# Patient Record
Sex: Male | Born: 1935 | Race: White | Hispanic: No | Marital: Married | Smoking: Never smoker
Health system: Southern US, Community
[De-identification: ages and names within clinical notes are randomized; demographics above are authoritative.]

## PROBLEM LIST (undated history)

## (undated) DIAGNOSIS — E785 Hyperlipidemia, unspecified: Secondary | ICD-10-CM

## (undated) DIAGNOSIS — B029 Zoster without complications: Secondary | ICD-10-CM

## (undated) DIAGNOSIS — S43006A Unspecified dislocation of unspecified shoulder joint, initial encounter: Secondary | ICD-10-CM

## (undated) DIAGNOSIS — L570 Actinic keratosis: Secondary | ICD-10-CM

## (undated) DIAGNOSIS — G4730 Sleep apnea, unspecified: Secondary | ICD-10-CM

## (undated) DIAGNOSIS — G5620 Lesion of ulnar nerve, unspecified upper limb: Secondary | ICD-10-CM

## (undated) DIAGNOSIS — K219 Gastro-esophageal reflux disease without esophagitis: Secondary | ICD-10-CM

## (undated) DIAGNOSIS — K649 Unspecified hemorrhoids: Secondary | ICD-10-CM

## (undated) HISTORY — PX: JOINT REPLACEMENT: SHX530

## (undated) HISTORY — PX: OTHER SURGICAL HISTORY: SHX169

## (undated) HISTORY — PX: FRACTURE SURGERY: SHX138

## (undated) HISTORY — PX: COLONOSCOPY: SHX174

## (undated) HISTORY — DX: Actinic keratosis: L57.0

## (undated) HISTORY — PX: BACK SURGERY: SHX140

---

## 2004-12-21 ENCOUNTER — Encounter: Payer: Self-pay | Admitting: Orthopaedic Surgery

## 2005-01-05 ENCOUNTER — Encounter: Payer: Self-pay | Admitting: Orthopaedic Surgery

## 2005-02-04 ENCOUNTER — Encounter: Payer: Self-pay | Admitting: Orthopaedic Surgery

## 2005-03-07 ENCOUNTER — Encounter: Payer: Self-pay | Admitting: Orthopaedic Surgery

## 2006-01-13 ENCOUNTER — Ambulatory Visit: Payer: Self-pay | Admitting: Family Medicine

## 2006-01-13 ENCOUNTER — Ambulatory Visit: Payer: Self-pay | Admitting: Orthopaedic Surgery

## 2006-01-16 ENCOUNTER — Ambulatory Visit: Payer: Self-pay | Admitting: Orthopaedic Surgery

## 2006-05-06 ENCOUNTER — Ambulatory Visit: Payer: Self-pay | Admitting: Gastroenterology

## 2007-09-23 ENCOUNTER — Ambulatory Visit: Payer: Self-pay | Admitting: Family Medicine

## 2010-10-07 DIAGNOSIS — B029 Zoster without complications: Secondary | ICD-10-CM

## 2010-10-07 HISTORY — DX: Zoster without complications: B02.9

## 2013-09-16 DIAGNOSIS — C4492 Squamous cell carcinoma of skin, unspecified: Secondary | ICD-10-CM

## 2013-09-16 HISTORY — DX: Squamous cell carcinoma of skin, unspecified: C44.92

## 2014-06-24 DIAGNOSIS — M169 Osteoarthritis of hip, unspecified: Secondary | ICD-10-CM | POA: Diagnosis present

## 2014-08-10 ENCOUNTER — Ambulatory Visit: Payer: Self-pay | Admitting: Orthopedic Surgery

## 2014-08-10 LAB — URINALYSIS, COMPLETE
Bacteria: NONE SEEN
Bilirubin,UR: NEGATIVE
Blood: NEGATIVE
Glucose,UR: NEGATIVE mg/dL (ref 0–75)
Leukocyte Esterase: NEGATIVE
NITRITE: NEGATIVE
Ph: 6 (ref 4.5–8.0)
Protein: NEGATIVE
RBC,UR: <1 /HPF (ref 0–5)
SQUAMOUS EPITHELIAL: NONE SEEN
Specific Gravity: 1.02 (ref 1.003–1.030)
WBC UR: 1 /HPF (ref 0–5)

## 2014-08-10 LAB — MRSA PCR SCREENING

## 2014-08-19 ENCOUNTER — Inpatient Hospital Stay: Payer: Self-pay | Admitting: Orthopedic Surgery

## 2014-08-20 LAB — BASIC METABOLIC PANEL
Anion Gap: 5 — ABNORMAL LOW (ref 7–16)
BUN: 13 mg/dL (ref 7–18)
CO2: 29 mmol/L (ref 21–32)
Calcium, Total: 7.2 mg/dL — ABNORMAL LOW (ref 8.5–10.1)
Chloride: 105 mmol/L (ref 98–107)
Creatinine: 0.98 mg/dL (ref 0.60–1.30)
EGFR (Non-African Amer.): >60
Glucose: 110 mg/dL — ABNORMAL HIGH (ref 65–99)
Osmolality: 278 (ref 275–301)
Potassium: 4.1 mmol/L (ref 3.5–5.1)
Sodium: 139 mmol/L (ref 136–145)

## 2014-08-20 LAB — HEMOGLOBIN: HGB: 12.1 g/dL — ABNORMAL LOW (ref 13.0–18.0)

## 2014-08-20 LAB — PLATELET COUNT: Platelet: 194 10*3/uL (ref 150–440)

## 2014-08-21 DIAGNOSIS — Z96642 Presence of left artificial hip joint: Secondary | ICD-10-CM | POA: Insufficient documentation

## 2014-08-21 LAB — BASIC METABOLIC PANEL
ANION GAP: 7 (ref 7–16)
BUN: 15 mg/dL (ref 7–18)
CO2: 28 mmol/L (ref 21–32)
CREATININE: 0.81 mg/dL (ref 0.60–1.30)
Calcium, Total: 7.9 mg/dL — ABNORMAL LOW (ref 8.5–10.1)
Chloride: 103 mmol/L (ref 98–107)
EGFR (African American): >60
Glucose: 100 mg/dL — ABNORMAL HIGH (ref 65–99)
Osmolality: 277 (ref 275–301)
Potassium: 3.9 mmol/L (ref 3.5–5.1)
Sodium: 138 mmol/L (ref 136–145)

## 2014-08-21 LAB — HEMOGLOBIN: HGB: 12.5 g/dL — AB (ref 13.0–18.0)

## 2015-01-28 NOTE — Op Note (Signed)
PATIENT NAME:  Harold Nelson, KASER MR#:  [id number] DATE OF BIRTH:  10-20-1934  DATE OF PROCEDURE:  08/19/2014  PREOPERATIVE DIAGNOSIS: Left hip osteoarthritis.   POSTOPERATIVE DIAGNOSIS: Left hip osteoarthritis, possible avascular necrosis.   ANESTHESIA: Spinal.   SURGEON: Hessie Knows, M.D.   DESCRIPTION OF PROCEDURE: The patient was brought to the operating room and after adequate anesthesia was obtained, the patient was placed on the operative table with the Medacta attachment. The left foot was placed in the [place]. The right foot, right leg on a well-padded table. After prepping and draping in the usual sterile fashion, preoperative x-ray was taken as a template and appropriate patient identification and timeout procedures completed.   A direct anterior approach was made centered over the tensor fascia lata muscle and the greater trochanter. The incision was carried down through the skin and subcutaneous tissue getting down to the TFL.  The tensor fascia was incised and the muscle retracted laterally. Deep fascia incised and the lateral circumflex vessels identified and ligated. The anterior capsule was then exposed and a capsulotomy performed. The femoral head was then removed after osteotomy of the femoral neck at the appropriate level. The joint had a large amount of fluid within it and  after removal of the head, it was noted to have peeling off the cartilage with possibly avascular necrosis being a condition as well as significant osteoarthritis. The acetabulum had quite a bit of synovitis. This was debrided along with the labrum. Reaming was carried out to 54 mm, at which point there was good bleeding bone. A 54 mm trial fit well and the 54 mm Versafit cup DM was impacted into position. The femur was then externally rotated. Pubofemoral and partial ischiofemoral releases carried out. The leg was dropped into extension and sequential broaching was carried out up to a size 4, which gave a very  good fit in the canal. Trials were made with the broach after the 4 stem was inserted. The S head and a Versafit cup DM  liner were assembled. After the stem was inserted the dual mobility head was impacted and the hip was reduced. It was quite stable and leg lengths appeared equal to preop x-ray. The hip was then thoroughly irrigated.   The wound was infiltrated with 0.25% Sensorcaine with epinephrine, 30 mL. The wound was closed with a heavy quill for the tensor fascia followed by 2-0 quill subcutaneously and skin staples. Xeroform, 4 x 4's, ABD, and tape applied.   The patient was sent to the recovery room in stable condition.   ESTIMATED BLOOD LOSS: 200 mL.   COMPLICATIONS: None.   SPECIMEN REMOVED: Femoral head.   IMPLANTS: Medacta Amis stem size 4, Versafit cup DM 54 with liner, and an S 28 mm head.    ____________________________ Laurene Footman, MD mjm:bm D: 08/19/2014 18:22:18 ET T: 08/19/2014 21:52:04 ET JOB#: [id number]  cc: Laurene Footman, MD, <Dictator> Laurene Footman MD ELECTRONICALLY SIGNED 08/20/2014 8:58

## 2015-01-28 NOTE — Discharge Summary (Signed)
PATIENT NAME:  Harold Nelson, Harold Nelson MR#:  [id number] DATE OF BIRTH:  January 18, 1935  DATE OF ADMISSION:  08/19/2014 DATE OF DISCHARGE:  08/22/2014  ADMITTING DIAGNOSIS: Left hip osteoarthritis.   DISCHARGE DIAGNOSIS: Left hip osteoarthritis, possible avascular necrosis.  ANESTHESIA: Spinal.   SURGEON: Laurene Footman, M.D.   ESTIMATED BLOOD LOSS: 200 mL.  COMPLICATIONS: None.   SPECIMEN REMOVED: Femoral head.   IMPLANTS: Medacta Amis stem size 4, Versafit cup DM 54 with liner and a S28 mm head.   HISTORY: The patient has had significant pain in the left hip that has been moderate to severe. The patient has been able to get socks and shoes on with some difficulty. He can sit in high chair for 1/2 hour. He can transfer from sitting to standing with use of arms. He can manage stairs normally, one foot over the other with rail. He can pick up an object from the floor with some difficulty. He does not require a gait aide. He can carry with significant limits. He is able to drive a car. The patient's pain is located in the left groin, radiates down the left anterior thigh, and he has had no relief with recent cortisone injection. X-ray shows severe osteoarthritis. Pain is interfering with activities of daily living.   PHYSICAL EXAMINATION: GENERAL: No apparent distress, well nourished, well developed.  EYES: Pupils equal, round, and reactive to light.  LYMPHATIC: No adenopathy.  HEART: Regular rate and rhythm. No murmur.  HEENT: Normal.  LUNGS: Clear to auscultation. No labored breathing. LEFT HIP: The patient has a slow gait with moderate limp. Leg lengths are equal. He has a normal back exam. The left hip shows significant pain with hip internal rotation. There is no pain with external rotation. He has 5/5 strength with hip flexion, abduction, quads, dorsiflexion and plantar flexion. He has 2+ patellar and Achilles reflexes. He is neurovascularly intact in the left lower extremity.   HOSPITAL COURSE:  The patient was admitted to the hospital on August 19, 2014. He had surgery that same day and was brought to the orthopedic floor from the PAC-U in stable condition. On postop day 1, the patient's pain was moderate. He was controlled with medications. Vital signs were stable. He had good progress with physical therapy. On postop day 2, the patient continued to make progress with physical therapy. Labs were stable. Vital signs were stable. On postop day 3, the patient was doing well. He had met goals to go home with home health PT.  CONDITION AT DISCHARGE: Stable.   DISCHARGE INSTRUCTIONS: The patient can increase weight-bearing on the effected extremity. Thigh-high TED hose on both legs to be removed at bedtime and replace when arising the next morning. Elevate the heels off the bed. Incentive spirometer every hour while awake. Encourage cough and deep breathing. The patient may resume a regular diet as tolerated. Apply an ice pack to the effected area. Do not get the dressing or bandage wet or dirty. Call [facility] orthopedics if the dressing gets water under it. Leave the dressing on. Call [facility] orthopedics if any of the following occur: Bright red bleeding from the incision wound, fever above 101.5 degrees, redness, swelling, or drainage at the incision. Call [facility] orthopedics if you experience any increased leg pain, numbness or weakness in your legs or bowel or bladder symptoms. Home physical therapy has been arranged for continuation of rehab program. Please call [facility] orthopedics if a therapist has not contacted you within 51  hours of your return home. Call [facility] orthopedics for a follow-up appointment. The patient needs be seen in [place] in 2 weeks.   DISCHARGE MEDICATIONS: Please see list of discharge medications on discharge instruction page.   ____________________________ T. Rachelle Hora, PA-C tcg:sb D: 08/22/2014 08:18:31  ET T: 08/22/2014 08:49:13 ET JOB#: [id number]  cc: T. Rachelle Hora, PA-C, <Dictator> Duanne Guess [place] ELECTRONICALLY SIGNED 08/23/2014 12:42

## 2015-01-30 LAB — SURGICAL PATHOLOGY

## 2015-05-15 ENCOUNTER — Other Ambulatory Visit: Payer: Self-pay | Admitting: Orthopedic Surgery

## 2015-05-15 DIAGNOSIS — M7121 Synovial cyst of popliteal space [Baker], right knee: Secondary | ICD-10-CM

## 2015-05-15 DIAGNOSIS — M25461 Effusion, right knee: Secondary | ICD-10-CM

## 2015-05-15 DIAGNOSIS — M25561 Pain in right knee: Secondary | ICD-10-CM

## 2015-05-19 ENCOUNTER — Ambulatory Visit
Admission: RE | Admit: 2015-05-19 | Discharge: 2015-05-19 | Disposition: A | Discharge status: Home / Self Care | Payer: Medicare Other | Source: Ambulatory Visit | Attending: Orthopedic Surgery | Admitting: Orthopedic Surgery

## 2015-05-19 DIAGNOSIS — M7121 Synovial cyst of popliteal space [Baker], right knee: Secondary | ICD-10-CM | POA: Diagnosis not present

## 2015-05-19 DIAGNOSIS — M25561 Pain in right knee: Secondary | ICD-10-CM | POA: Diagnosis present

## 2015-05-19 DIAGNOSIS — M23303 Other meniscus derangements, unspecified medial meniscus, right knee: Secondary | ICD-10-CM | POA: Insufficient documentation

## 2015-05-19 DIAGNOSIS — M25461 Effusion, right knee: Secondary | ICD-10-CM | POA: Diagnosis present

## 2015-05-19 DIAGNOSIS — M238X1 Other internal derangements of right knee: Secondary | ICD-10-CM | POA: Diagnosis not present

## 2015-05-19 DIAGNOSIS — M23300 Other meniscus derangements, unspecified lateral meniscus, right knee: Secondary | ICD-10-CM | POA: Diagnosis not present

## 2015-05-19 DIAGNOSIS — D7589 Other specified diseases of blood and blood-forming organs: Secondary | ICD-10-CM | POA: Insufficient documentation

## 2016-05-06 ENCOUNTER — Encounter: Payer: Self-pay | Admitting: *Deleted

## 2016-05-07 ENCOUNTER — Ambulatory Visit
Admission: RE | Admit: 2016-05-07 | Discharge: 2016-05-07 | Disposition: A | Discharge status: Home / Self Care | Payer: Medicare Other | Source: Ambulatory Visit | Attending: Gastroenterology | Admitting: Gastroenterology

## 2016-05-07 ENCOUNTER — Encounter
Admission: RE | Disposition: A | Discharge status: Home / Self Care | Payer: Self-pay | Source: Ambulatory Visit | Attending: Gastroenterology

## 2016-05-07 ENCOUNTER — Ambulatory Visit: Payer: Medicare Other | Admitting: Anesthesiology

## 2016-05-07 DIAGNOSIS — K640 First degree hemorrhoids: Secondary | ICD-10-CM | POA: Diagnosis not present

## 2016-05-07 DIAGNOSIS — D125 Benign neoplasm of sigmoid colon: Secondary | ICD-10-CM | POA: Insufficient documentation

## 2016-05-07 DIAGNOSIS — Z1211 Encounter for screening for malignant neoplasm of colon: Secondary | ICD-10-CM | POA: Diagnosis not present

## 2016-05-07 DIAGNOSIS — Z7982 Long term (current) use of aspirin: Secondary | ICD-10-CM | POA: Diagnosis not present

## 2016-05-07 DIAGNOSIS — K5730 Diverticulosis of large intestine without perforation or abscess without bleeding: Secondary | ICD-10-CM | POA: Insufficient documentation

## 2016-05-07 DIAGNOSIS — Z79899 Other long term (current) drug therapy: Secondary | ICD-10-CM | POA: Diagnosis not present

## 2016-05-07 DIAGNOSIS — E785 Hyperlipidemia, unspecified: Secondary | ICD-10-CM | POA: Insufficient documentation

## 2016-05-07 HISTORY — DX: Unspecified dislocation of unspecified shoulder joint, initial encounter: S43.006A

## 2016-05-07 HISTORY — DX: Lesion of ulnar nerve, unspecified upper limb: G56.20

## 2016-05-07 HISTORY — DX: Hyperlipidemia, unspecified: E78.5

## 2016-05-07 HISTORY — PX: COLONOSCOPY WITH PROPOFOL: SHX5780

## 2016-05-07 HISTORY — DX: Zoster without complications: B02.9

## 2016-05-07 HISTORY — DX: Unspecified hemorrhoids: K64.9

## 2016-05-07 SURGERY — COLONOSCOPY WITH PROPOFOL
Anesthesia: General

## 2016-05-07 MED ORDER — PROPOFOL 10 MG/ML IV BOLUS
INTRAVENOUS | Status: DC | PRN
Start: 2016-05-07 — End: 2016-05-07
  Administered 2016-05-07: 20 mg via INTRAVENOUS
  Administered 2016-05-07 (×2): 30 mg via INTRAVENOUS

## 2016-05-07 MED ORDER — SODIUM CHLORIDE 0.9 % IV SOLN
INTRAVENOUS | Status: DC
  Administered 2016-05-07: 1000 mL via INTRAVENOUS

## 2016-05-07 MED ORDER — PROPOFOL 500 MG/50ML IV EMUL
INTRAVENOUS | Status: DC | PRN
  Administered 2016-05-07: 60 ug/kg/min via INTRAVENOUS

## 2016-05-07 MED ORDER — SODIUM CHLORIDE 0.9 % IV SOLN: INTRAVENOUS | Status: DC

## 2016-05-07 MED ORDER — LIDOCAINE HCL (PF) 2 % IJ SOLN
INTRAMUSCULAR | Status: DC | PRN
  Administered 2016-05-07: 50 mg via INTRADERMAL

## 2016-05-07 NOTE — Transfer of Care (Signed)
Immediate Anesthesia Transfer of Care Note  Patient: Harold Nelson  Procedure(s) Performed: Procedure(s): COLONOSCOPY WITH PROPOFOL (N/A)  Patient Location: PACU  Anesthesia Type:General  Level of Consciousness: awake  Airway & Oxygen Therapy: Patient Spontanous Breathing  Post-op Assessment: Report given to RN and Post -op Vital signs reviewed and stable  Post vital signs: stable  Last Vitals:  Vitals:   05/07/16 0946 05/07/16 1100  BP: (!) 142/90   Pulse: 62   Resp: 16   Temp: 36.4 C (P) 36.4 C    Last Pain:  Vitals:   05/07/16 1100  TempSrc: (P) Tympanic         Complications: No apparent anesthesia complications

## 2016-05-07 NOTE — H&P (Signed)
Outpatient short stay form Pre-procedure 05/07/2016 10:12 AM Lollie Sails MD  Primary Physician: Dr. Maryland Pink  Reason for visit:  Colonoscopy  History of present illness:  Patient is a 81 year old male presenting today for screening colonoscopy. His last colonoscopy was in 2007. There were no findings at that time. He tolerated his prep well. He does take 81 mg aspirin but has held that for several days.    Current Facility-Administered Medications:  .  0.9 %  sodium chloride infusion, , Intravenous, Continuous, Lollie Sails, MD, Last Rate: 20 mL/hr at 05/07/16 0955, 1,000 mL at 05/07/16 0955 .  0.9 %  sodium chloride infusion, , Intravenous, Continuous, Lollie Sails, MD  Prescriptions Prior to Admission  Medication Sig Dispense Refill Last Dose  . aspirin 81 MG tablet Take 81 mg by mouth daily.     . Coenzyme Q10 400 MG CAPS Take by mouth.     . Melatonin 5 MG CAPS Take by mouth.     . Misc Natural Products (MENS PROSTATE HEALTH FORMULA PO) Take by mouth.     . Multiple Vitamin (MULTIVITAMIN) tablet Take 1 tablet by mouth daily.     . Omega-3 Fatty Acids (OMEGA-3 FISH OIL PO) Take by mouth.     . Red Yeast Rice Extract 600 MG CAPS Take by mouth.     . triamcinolone (KENALOG) 0.025 % ointment Apply 1 application topically 2 (two) times daily.        No Known Allergies   Past Medical History:  Diagnosis Date  . Dislocated shoulder   . Hemorrhoids   . Hyperlipidemia   . Shingles   . Ulnar neuropathy     Review of systems:      Physical Exam    Heart and lungs: Regular rate and rhythm without rub or gallop, lungs are bilaterally clear.    HEENT: Normocephalic atraumatic eyes are anicteric    Other:     Pertinant exam for procedure: Soft nontender nondistended bowel sounds positive normoactive.    Planned proceedures: Colonoscopy and indicated procedures. I have discussed the risks benefits and complications of procedures to include not limited to  bleeding, infection, perforation and the risk of sedation and the patient wishes to proceed.    Lollie Sails, MD Gastroenterology 05/07/2016  10:12 AM

## 2016-05-07 NOTE — Anesthesia Postprocedure Evaluation (Signed)
Anesthesia Post Note  Patient: Kyrell Files  Procedure(s) Performed: Procedure(s) (LRB): COLONOSCOPY WITH PROPOFOL (N/A)  Patient location during evaluation: PACU Anesthesia Type: General Level of consciousness: awake and alert Pain management: pain level controlled Vital Signs Assessment: post-procedure vital signs reviewed and stable Respiratory status: spontaneous breathing, nonlabored ventilation and respiratory function stable Cardiovascular status: blood pressure returned to baseline and stable Postop Assessment: no signs of nausea or vomiting Anesthetic complications: no    Last Vitals:  Vitals:   05/07/16 1101 05/07/16 1111  BP: 99/70 (!) 143/81  Pulse: (!) 57 (!) 57  Resp: 11 (!) 21  Temp:      Last Pain:  Vitals:   05/07/16 1100  TempSrc: Tympanic                 Agron Swiney

## 2016-05-07 NOTE — Op Note (Signed)
[facility] Gastroenterology Patient Name: Harold Nelson Procedure Date: 05/07/2016 10:18 AM MRN: [id number] Account #: [id number] Date of Birth: 10/24/1934 Admit Type: Outpatient Age: 81 Room: [facility] ENDO ROOM 3 Gender: Male Note Status: Finalized Procedure:            Colonoscopy Indications:          Screening for colorectal malignant neoplasm Providers:            Lollie Sails, MD Referring MD:         Irven Easterly. Kary Kos, MD (Referring MD) Medicines:            Monitored Anesthesia Care Complications:        No immediate complications. Procedure:            Pre-Anesthesia Assessment:                       - ASA Grade Assessment: II - A patient with mild                        systemic disease.                       After obtaining informed consent, the colonoscope was                        passed under direct vision. Throughout the procedure,                        the patient's blood pressure, pulse, and oxygen                        saturations were monitored continuously. The                        Colonoscope was introduced through the anus and                        advanced to the the cecum, identified by appendiceal                        orifice and ileocecal valve. The colonoscopy was                        performed with moderate difficulty due to significant                        looping and a tortuous colon. Successful completion of                        the procedure was aided by changing the patient to a                        prone position and using manual pressure. The quality                        of the bowel preparation was good. Findings:      Two flat and sessile polyps were found in the distal sigmoid colon. The       polyps were 1 to 5 mm in size. These polyps were removed with a cold  biopsy forceps. Resection and retrieval were complete.      Many small and large-mouthed diverticula were found in the sigmoid colon       and  descending colon.      Non-bleeding internal hemorrhoids were found during retroflexion and       during anoscopy. The hemorrhoids were small and Grade I (internal       hemorrhoids that do not prolapse).      note small thrombosed hemorhoid in the anal canal. Impression:           - Two 1 to 5 mm polyps in the distal sigmoid colon,                        removed with a cold biopsy forceps. Resected and                        retrieved.                       - Hemorrhoids. Recommendation:       - Discharge patient to home.                       - Use Analpram HC Cream 2.5%: Apply externally TID for                        10 days. Procedure Code(s):    --- Professional ---                       [phone], Colonoscopy, flexible; with biopsy, single or                        multiple Diagnosis Code(s):    --- Professional ---                       Z12.11, Encounter for screening for malignant neoplasm                        of colon                       D12.5, Benign neoplasm of sigmoid colon                       K64.9, Unspecified hemorrhoids CPT copyright 2016 American Medical Association. All rights reserved. The codes documented in this report are preliminary and upon coder review may  be revised to meet current compliance requirements. Lollie Sails, MD 05/07/2016 11:02:16 AM This report has been signed electronically. Number of Addenda: 0 Note Initiated On: 05/07/2016 10:18 AM Scope Withdrawal Time: 0 hours 6 minutes 6 seconds  Total Procedure Duration: 0 hours 24 minutes 38 seconds       [facility]

## 2016-05-07 NOTE — Anesthesia Preprocedure Evaluation (Signed)
Anesthesia Evaluation  Patient identified by MRN, date of birth, ID band Patient awake    Reviewed: Allergy & Precautions, NPO status , Patient's Chart, lab work & pertinent test results  History of Anesthesia Complications Negative for: history of anesthetic complications  Airway Mallampati: II  TM Distance: >3 FB Neck ROM: Full    Dental  (+) Caps   Pulmonary neg pulmonary ROS, neg sleep apnea, neg COPD,    breath sounds clear to auscultation- rhonchi (-) wheezing      Cardiovascular Exercise Tolerance: Good (-) hypertension(-) CAD and (-) Past MI  Rhythm:Regular Rate:Normal - Systolic murmurs and - Diastolic murmurs    Neuro/Psych negative neurological ROS  negative psych ROS   GI/Hepatic negative GI ROS, Neg liver ROS,   Endo/Other  negative endocrine ROSneg diabetes  Renal/GU negative Renal ROS     Musculoskeletal negative musculoskeletal ROS (+)   Abdominal (+) - obese,   Peds  Hematology negative hematology ROS (+)   Anesthesia Other Findings Past Medical History: No date: Dislocated shoulder No date: Hemorrhoids No date: Hyperlipidemia No date: Shingles No date: Ulnar neuropathy   Reproductive/Obstetrics                             Anesthesia Physical Anesthesia Plan  ASA: II  Anesthesia Plan: General   Post-op Pain Management:    Induction: Intravenous  Airway Management Planned: Natural Airway  Additional Equipment:   Intra-op Plan:   Post-operative Plan:   Informed Consent: I have reviewed the patients History and Physical, chart, labs and discussed the procedure including the risks, benefits and alternatives for the proposed anesthesia with the patient or authorized representative who has indicated his/her understanding and acceptance.     Plan Discussed with: Anesthesiologist  Anesthesia Plan Comments:         Anesthesia Quick Evaluation

## 2016-05-08 ENCOUNTER — Encounter: Payer: Self-pay | Admitting: Gastroenterology

## 2016-05-08 LAB — SURGICAL PATHOLOGY

## 2017-07-31 ENCOUNTER — Encounter
Admission: RE | Admit: 2017-07-31 | Discharge: 2017-07-31 | Disposition: A | Discharge status: Home / Self Care | Payer: Medicare Other | Source: Ambulatory Visit | Attending: Orthopedic Surgery | Admitting: Orthopedic Surgery

## 2017-07-31 DIAGNOSIS — Z7982 Long term (current) use of aspirin: Secondary | ICD-10-CM | POA: Insufficient documentation

## 2017-07-31 DIAGNOSIS — Z01818 Encounter for other preprocedural examination: Secondary | ICD-10-CM | POA: Diagnosis not present

## 2017-07-31 DIAGNOSIS — Z79899 Other long term (current) drug therapy: Secondary | ICD-10-CM | POA: Diagnosis not present

## 2017-07-31 DIAGNOSIS — M1611 Unilateral primary osteoarthritis, right hip: Secondary | ICD-10-CM | POA: Insufficient documentation

## 2017-07-31 DIAGNOSIS — Z01810 Encounter for preprocedural cardiovascular examination: Secondary | ICD-10-CM | POA: Diagnosis not present

## 2017-07-31 HISTORY — DX: Sleep apnea, unspecified: G47.30

## 2017-07-31 LAB — URINALYSIS, COMPLETE (UACMP) WITH MICROSCOPIC
BACTERIA UA: NONE SEEN
Bilirubin Urine: NEGATIVE
GLUCOSE, UA: NEGATIVE mg/dL
Hgb urine dipstick: NEGATIVE
Ketones, ur: NEGATIVE mg/dL
Leukocytes, UA: NEGATIVE
Nitrite: NEGATIVE
PH: 6 (ref 5.0–8.0)
Protein, ur: NEGATIVE mg/dL
RBC / HPF: NONE SEEN RBC/hpf (ref 0–5)
SPECIFIC GRAVITY, URINE: 1.011 (ref 1.005–1.030)
SQUAMOUS EPITHELIAL / LPF: NONE SEEN

## 2017-07-31 LAB — CBC
HEMATOCRIT: 45.7 % (ref 40.0–52.0)
Hemoglobin: 15.3 g/dL (ref 13.0–18.0)
MCH: 29.9 pg (ref 26.0–34.0)
MCHC: 33.5 g/dL (ref 32.0–36.0)
MCV: 89.3 fL (ref 80.0–100.0)
PLATELETS: 235 10*3/uL (ref 150–440)
RBC: 5.12 MIL/uL (ref 4.40–5.90)
RDW: 13.4 % (ref 11.5–14.5)
WBC: 5.8 10*3/uL (ref 3.8–10.6)

## 2017-07-31 LAB — SEDIMENTATION RATE: SED RATE: 9 mm/h (ref 0–20)

## 2017-07-31 LAB — TYPE AND SCREEN
ABO/RH(D): A POS
ANTIBODY SCREEN: NEGATIVE

## 2017-07-31 LAB — BASIC METABOLIC PANEL
Anion gap: 11 (ref 5–15)
BUN: 19 mg/dL (ref 6–20)
CHLORIDE: 103 mmol/L (ref 101–111)
CO2: 26 mmol/L (ref 22–32)
Calcium: 9.2 mg/dL (ref 8.9–10.3)
Creatinine, Ser: 1.03 mg/dL (ref 0.61–1.24)
GFR calc Af Amer: >60 mL/min (ref >60)
GLUCOSE: 105 mg/dL — AB (ref 65–99)
POTASSIUM: 3.9 mmol/L (ref 3.5–5.1)
Sodium: 140 mmol/L (ref 135–145)

## 2017-07-31 LAB — SURGICAL PCR SCREEN
MRSA, PCR: POSITIVE — AB
Staphylococcus aureus: POSITIVE — AB

## 2017-07-31 LAB — PROTIME-INR
INR: 0.88
Prothrombin Time: 11.9 seconds (ref 11.4–15.2)

## 2017-07-31 LAB — APTT: aPTT: 29 seconds (ref 24–36)

## 2017-07-31 NOTE — Patient Instructions (Signed)
Your procedure is scheduled [id number] 6, 2018 (TUESDAY ) Report to Same Day Surgery.([facility]) SECOND FLOOR To find out your arrival time please call [phone] between 1PM - 3PM on August 11, 2017 ([place] ).  Remember: Instructions that are not followed completely may result in serious medical risk, up to and including death, or upon the discretion of your surgeon and anesthesiologist your surgery may need to be rescheduled.     _X__ 1. Do not eat food after midnight the night before your procedure.                 No gum chewing or hard candies. You may drink clear liquids up to 2 hours                 before you are scheduled to arrive for your surgery- DO not drink clear                 liquids within 2 hours of the start of your surgery.                 Clear Liquids include:  water, apple juice without pulp, clear carbohydrate                 drink such as Clearfast of Gartorade, Black Coffee or Tea (Do not add                 anything to coffee or tea).     _X__ 2.  No Alcohol for 24 hours before or after surgery.   _X__ 3.  Do Not Smoke or use e-cigarettes For 24 Hours Prior to Your Surgery.                 Do not use any chewable tobacco products for at least 6 hours prior to                 surgery.  ____  4.  Bring all medications with you on the day of surgery if instructed.   __X__  5.  Notify your doctor if there is any change in your medical condition      (cold, fever, infections).     Do not wear jewelry, make-up, hairpins, clips or nail polish. Do not wear lotions, powders, or perfumes.  Do not shave 48 hours prior to surgery. Men may shave face and neck. Do not bring valuables to the hospital.    [facility] is not responsible for any belongings or valuables.  Contacts, dentures or bridgework may not be worn into surgery. Leave your suitcase in the car. After surgery it may be brought to your room. For patients admitted to the  hospital, discharge time is determined by your treatment team.   Patients discharged the day of surgery will not be allowed to drive home.   Please read over the following fact sheets that you were given:   MRSA Information          ____ Take these medicines the morning of surgery with A SIP OF WATER:    1.   2.   3.   4.  5.  6.  ____ Fleet Enema (as directed)   _X___ Use CHG Soap as directed  ____ Use inhalers on the day of surgery  ____ Stop metformin 2 days prior to surgery    ____ Take 1/2 of usual insulin dose the night before surgery. No insulin the morning  of surgery.   __X_ Stop Coumadin/Plavix/aspirin on (STOP ASPIRIN NOW )  __X__ Stop Anti-inflammatories on (NO ASPIRIN PRODUCTS ) MOTRIN, ADVIL, ALEVE, IBUPROFEN, BC'S, GOODYS ) TYLENOL OK TO TAKE FOR PAIN IF NEEDED )   __X__ Stop supplements until after surgery. (STOP RED YEAST RICE, TURMERIC, OMEGA-3 FISH OIL, CO Q 10, MELATONIN, MENS PROSTATE NOW )    ____ Bring C-Pap to the hospital.

## 2017-07-31 NOTE — Pre-Procedure Instructions (Signed)
Positive MRSA, PCR Screen faxed and called to Dr. Rudene Christians office (spoke to [employer])

## 2017-08-01 LAB — URINE CULTURE: CULTURE: NO GROWTH

## 2017-08-11 MED ORDER — TRANEXAMIC ACID 1000 MG/10ML IV SOLN
1000.0000 mg | INTRAVENOUS | Status: AC
  Administered 2017-08-12: 1000 mg via INTRAVENOUS
  Filled 2017-08-11: qty 10

## 2017-08-11 MED ORDER — CEFAZOLIN SODIUM-DEXTROSE 2-4 GM/100ML-% IV SOLN
2.0000 g | Freq: Once | INTRAVENOUS | Status: AC
Start: 2017-08-11 — End: 2017-08-12
  Administered 2017-08-12: 2 g via INTRAVENOUS

## 2017-08-11 MED ORDER — VANCOMYCIN HCL IN DEXTROSE 1-5 GM/200ML-% IV SOLN
1000.0000 mg | Freq: Once | INTRAVENOUS | Status: AC
  Administered 2017-08-12: 1000 mg via INTRAVENOUS

## 2017-08-12 ENCOUNTER — Inpatient Hospital Stay: Payer: Medicare Other

## 2017-08-12 ENCOUNTER — Other Ambulatory Visit: Payer: Self-pay

## 2017-08-12 ENCOUNTER — Encounter
Admission: RE | Disposition: A | Discharge status: Home / Self Care | Payer: Self-pay | Source: Ambulatory Visit | Attending: Orthopedic Surgery

## 2017-08-12 ENCOUNTER — Inpatient Hospital Stay: Payer: Medicare Other | Admitting: Certified Registered"

## 2017-08-12 ENCOUNTER — Encounter: Payer: Self-pay | Admitting: *Deleted

## 2017-08-12 ENCOUNTER — Inpatient Hospital Stay
Admission: RE | Admit: 2017-08-12 | Discharge: 2017-08-14 | DRG: 470 | Disposition: A | Discharge status: Home / Self Care | Payer: Medicare Other | Source: Ambulatory Visit | Attending: Orthopedic Surgery | Admitting: Orthopedic Surgery

## 2017-08-12 DIAGNOSIS — Z8614 Personal history of Methicillin resistant Staphylococcus aureus infection: Secondary | ICD-10-CM | POA: Diagnosis not present

## 2017-08-12 DIAGNOSIS — Z7982 Long term (current) use of aspirin: Secondary | ICD-10-CM

## 2017-08-12 DIAGNOSIS — E785 Hyperlipidemia, unspecified: Secondary | ICD-10-CM | POA: Diagnosis present

## 2017-08-12 DIAGNOSIS — G8918 Other acute postprocedural pain: Secondary | ICD-10-CM

## 2017-08-12 DIAGNOSIS — Z419 Encounter for procedure for purposes other than remedying health state, unspecified: Secondary | ICD-10-CM

## 2017-08-12 DIAGNOSIS — M1611 Unilateral primary osteoarthritis, right hip: Principal | ICD-10-CM | POA: Diagnosis present

## 2017-08-12 DIAGNOSIS — Z79899 Other long term (current) drug therapy: Secondary | ICD-10-CM | POA: Diagnosis not present

## 2017-08-12 DIAGNOSIS — G4730 Sleep apnea, unspecified: Secondary | ICD-10-CM | POA: Diagnosis present

## 2017-08-12 DIAGNOSIS — M169 Osteoarthritis of hip, unspecified: Secondary | ICD-10-CM | POA: Diagnosis present

## 2017-08-12 HISTORY — PX: TOTAL HIP ARTHROPLASTY: SHX124

## 2017-08-12 LAB — CBC
HEMATOCRIT: 39.9 % — AB (ref 40.0–52.0)
HEMOGLOBIN: 13.4 g/dL (ref 13.0–18.0)
MCH: 30.4 pg (ref 26.0–34.0)
MCHC: 33.6 g/dL (ref 32.0–36.0)
MCV: 90.5 fL (ref 80.0–100.0)
Platelets: 229 10*3/uL (ref 150–440)
RBC: 4.41 MIL/uL (ref 4.40–5.90)
RDW: 13.5 % (ref 11.5–14.5)
WBC: 8.9 10*3/uL (ref 3.8–10.6)

## 2017-08-12 LAB — CREATININE, SERUM
CREATININE: 0.93 mg/dL (ref 0.61–1.24)
GFR calc Af Amer: >60 mL/min (ref >60)
GFR calc non Af Amer: >60 mL/min (ref >60)

## 2017-08-12 LAB — ABO/RH: ABO/RH(D): A POS

## 2017-08-12 SURGERY — ARTHROPLASTY, HIP, TOTAL, ANTERIOR APPROACH
Anesthesia: Spinal | Site: Hip | Laterality: Right | Wound class: Clean

## 2017-08-12 MED ORDER — TURMERIC 450 MG PO CAPS: 1.0000 | ORAL_CAPSULE | Freq: Every day | ORAL | Status: DC

## 2017-08-12 MED ORDER — ONDANSETRON HCL 4 MG PO TABS: 4.0000 mg | ORAL_TABLET | Freq: Four times a day (QID) | ORAL | Status: DC | PRN

## 2017-08-12 MED ORDER — PHENYLEPHRINE HCL 10 MG/ML IJ SOLN
INTRAMUSCULAR | Status: DC | PRN
  Administered 2017-08-12: 100 ug via INTRAVENOUS
  Administered 2017-08-12 (×2): 200 ug via INTRAVENOUS
  Administered 2017-08-12: 100 ug via INTRAVENOUS

## 2017-08-12 MED ORDER — KETAMINE HCL 50 MG/ML IJ SOLN
INTRAMUSCULAR | Status: DC | PRN
  Administered 2017-08-12: 25 mg via INTRAVENOUS

## 2017-08-12 MED ORDER — ENOXAPARIN SODIUM 40 MG/0.4ML ~~LOC~~ SOLN
40.0000 mg | SUBCUTANEOUS | Status: DC
  Administered 2017-08-13 – 2017-08-14 (×2): 40 mg via SUBCUTANEOUS
  Filled 2017-08-12 (×2): qty 0.4

## 2017-08-12 MED ORDER — TIMOLOL HEMIHYDRATE 0.5 % OP SOLN
1.0000 [drp] | Freq: Every day | OPHTHALMIC | Status: DC
  Administered 2017-08-13: 1 [drp] via OPHTHALMIC
  Filled 2017-08-12: qty 5

## 2017-08-12 MED ORDER — MELATONIN 5 MG PO TABS
1.0000 | ORAL_TABLET | Freq: Every evening | ORAL | Status: DC | PRN
  Filled 2017-08-12: qty 1

## 2017-08-12 MED ORDER — PROPOFOL 500 MG/50ML IV EMUL
INTRAVENOUS | Status: DC | PRN
  Administered 2017-08-12: 25 ug/kg/min via INTRAVENOUS

## 2017-08-12 MED ORDER — VANCOMYCIN HCL IN DEXTROSE 1-5 GM/200ML-% IV SOLN
INTRAVENOUS | Status: AC
  Administered 2017-08-12: 1000 mg via INTRAVENOUS
  Filled 2017-08-12: qty 200

## 2017-08-12 MED ORDER — HYDROCODONE-ACETAMINOPHEN 5-325 MG PO TABS
1.0000 | ORAL_TABLET | ORAL | Status: DC | PRN
  Administered 2017-08-12 – 2017-08-13 (×4): 1 via ORAL
  Filled 2017-08-12 (×5): qty 1

## 2017-08-12 MED ORDER — NEOMYCIN-POLYMYXIN B GU 40-200000 IR SOLN
Status: DC | PRN
  Administered 2017-08-12: 4 mL

## 2017-08-12 MED ORDER — METHOCARBAMOL 500 MG PO TABS: 500.0000 mg | ORAL_TABLET | Freq: Four times a day (QID) | ORAL | Status: DC | PRN

## 2017-08-12 MED ORDER — GLYCOPYRROLATE 0.2 MG/ML IJ SOLN
INTRAMUSCULAR | Status: DC | PRN
Start: 2017-08-12 — End: 2017-08-12
  Administered 2017-08-12: 0.2 mg via INTRAVENOUS

## 2017-08-12 MED ORDER — OXYCODONE HCL 5 MG PO TABS
10.0000 mg | ORAL_TABLET | ORAL | Status: DC | PRN
  Administered 2017-08-12 – 2017-08-14 (×12): 10 mg via ORAL
  Filled 2017-08-12 (×12): qty 2

## 2017-08-12 MED ORDER — PROPOFOL 10 MG/ML IV BOLUS
INTRAVENOUS | Status: AC
  Filled 2017-08-12: qty 20

## 2017-08-12 MED ORDER — MAGNESIUM HYDROXIDE 400 MG/5ML PO SUSP
30.0000 mL | Freq: Every day | ORAL | Status: DC | PRN
  Administered 2017-08-14: 30 mL via ORAL
  Filled 2017-08-12 (×2): qty 30

## 2017-08-12 MED ORDER — DEXTROSE 5 % IV SOLN
2.0000 g | Freq: Four times a day (QID) | INTRAVENOUS | Status: AC
  Administered 2017-08-12 – 2017-08-13 (×3): 2 g via INTRAVENOUS
  Filled 2017-08-12 (×4): qty 2000

## 2017-08-12 MED ORDER — FAMOTIDINE 20 MG PO TABS
ORAL_TABLET | ORAL | Status: AC
  Administered 2017-08-12: 20 mg via ORAL
  Filled 2017-08-12: qty 1

## 2017-08-12 MED ORDER — SODIUM CHLORIDE 0.9 % IV SOLN
INTRAVENOUS | Status: DC
  Administered 2017-08-12 – 2017-08-13 (×3): via INTRAVENOUS

## 2017-08-12 MED ORDER — BISACODYL 10 MG RE SUPP
10.0000 mg | Freq: Every day | RECTAL | Status: DC | PRN
  Administered 2017-08-14: 10 mg via RECTAL
  Filled 2017-08-12: qty 1

## 2017-08-12 MED ORDER — DOCUSATE SODIUM 100 MG PO CAPS
100.0000 mg | ORAL_CAPSULE | Freq: Two times a day (BID) | ORAL | Status: DC
  Administered 2017-08-12 – 2017-08-14 (×4): 100 mg via ORAL
  Filled 2017-08-12 (×4): qty 1

## 2017-08-12 MED ORDER — SODIUM CHLORIDE 0.9 % IV SOLN
INTRAVENOUS | Status: DC | PRN
  Administered 2017-08-12: 25 ug/min via INTRAVENOUS

## 2017-08-12 MED ORDER — ONDANSETRON HCL 4 MG/2ML IJ SOLN: 4.0000 mg | Freq: Four times a day (QID) | INTRAMUSCULAR | Status: DC | PRN

## 2017-08-12 MED ORDER — COENZYME Q10 100 MG PO CAPS: 1.0000 | ORAL_CAPSULE | Freq: Every day | ORAL | Status: DC

## 2017-08-12 MED ORDER — METHOCARBAMOL 1000 MG/10ML IJ SOLN
500.0000 mg | Freq: Four times a day (QID) | INTRAVENOUS | Status: DC | PRN
  Filled 2017-08-12: qty 5

## 2017-08-12 MED ORDER — MORPHINE SULFATE (PF) 2 MG/ML IV SOLN
2.0000 mg | INTRAVENOUS | Status: DC | PRN
  Administered 2017-08-12: 2 mg via INTRAVENOUS
  Filled 2017-08-12: qty 1

## 2017-08-12 MED ORDER — GLYCOPYRROLATE 0.2 MG/ML IJ SOLN
INTRAMUSCULAR | Status: AC
  Filled 2017-08-12: qty 1

## 2017-08-12 MED ORDER — ONDANSETRON HCL 4 MG/2ML IJ SOLN: 4.0000 mg | Freq: Once | INTRAMUSCULAR | Status: DC | PRN

## 2017-08-12 MED ORDER — EPHEDRINE SULFATE 50 MG/ML IJ SOLN
INTRAMUSCULAR | Status: DC | PRN
  Administered 2017-08-12: 10 mg via INTRAVENOUS

## 2017-08-12 MED ORDER — FENTANYL CITRATE (PF) 100 MCG/2ML IJ SOLN: 25.0000 ug | INTRAMUSCULAR | Status: DC | PRN

## 2017-08-12 MED ORDER — DIPHENHYDRAMINE HCL 12.5 MG/5ML PO ELIX: 12.5000 mg | ORAL_SOLUTION | ORAL | Status: DC | PRN

## 2017-08-12 MED ORDER — ADULT MULTIVITAMIN W/MINERALS CH
1.0000 | ORAL_TABLET | Freq: Every day | ORAL | Status: DC
  Administered 2017-08-13 – 2017-08-14 (×2): 1 via ORAL
  Filled 2017-08-12 (×5): qty 1

## 2017-08-12 MED ORDER — PHENOL 1.4 % MT LIQD
1.0000 | OROMUCOSAL | Status: DC | PRN
  Filled 2017-08-12: qty 177

## 2017-08-12 MED ORDER — LIDOCAINE HCL (PF) 2 % IJ SOLN
INTRAMUSCULAR | Status: DC | PRN
  Administered 2017-08-12: 50 mg

## 2017-08-12 MED ORDER — BUPIVACAINE-EPINEPHRINE 0.25% -1:200000 IJ SOLN
INTRAMUSCULAR | Status: DC | PRN
  Administered 2017-08-12: 30 mL

## 2017-08-12 MED ORDER — LACTATED RINGERS IV SOLN
INTRAVENOUS | Status: DC
  Administered 2017-08-12: 09:00:00 via INTRAVENOUS

## 2017-08-12 MED ORDER — NEOMYCIN-POLYMYXIN B GU 40-200000 IR SOLN
Status: AC
  Filled 2017-08-12: qty 4

## 2017-08-12 MED ORDER — BUPIVACAINE HCL (PF) 0.5 % IJ SOLN
INTRAMUSCULAR | Status: AC
  Filled 2017-08-12: qty 10

## 2017-08-12 MED ORDER — ASPIRIN EC 81 MG PO TBEC
81.0000 mg | DELAYED_RELEASE_TABLET | Freq: Every day | ORAL | Status: DC
  Administered 2017-08-13 – 2017-08-14 (×2): 81 mg via ORAL
  Filled 2017-08-12 (×2): qty 1

## 2017-08-12 MED ORDER — METOCLOPRAMIDE HCL 5 MG/ML IJ SOLN: 5.0000 mg | Freq: Three times a day (TID) | INTRAMUSCULAR | Status: DC | PRN

## 2017-08-12 MED ORDER — MENTHOL 3 MG MT LOZG
1.0000 | LOZENGE | OROMUCOSAL | Status: DC | PRN
  Filled 2017-08-12: qty 9

## 2017-08-12 MED ORDER — ACETAMINOPHEN 325 MG PO TABS
650.0000 mg | ORAL_TABLET | ORAL | Status: DC | PRN
  Administered 2017-08-14: 650 mg via ORAL
  Filled 2017-08-12: qty 2

## 2017-08-12 MED ORDER — CEFAZOLIN SODIUM-DEXTROSE 2-4 GM/100ML-% IV SOLN: 2.0000 g | Freq: Four times a day (QID) | INTRAVENOUS | Status: DC

## 2017-08-12 MED ORDER — MIDAZOLAM HCL 5 MG/5ML IJ SOLN
INTRAMUSCULAR | Status: DC | PRN
  Administered 2017-08-12: 1 mg via INTRAVENOUS
  Administered 2017-08-12: 2 mg via INTRAVENOUS

## 2017-08-12 MED ORDER — METOCLOPRAMIDE HCL 10 MG PO TABS: 5.0000 mg | ORAL_TABLET | Freq: Three times a day (TID) | ORAL | Status: DC | PRN

## 2017-08-12 MED ORDER — CEFAZOLIN SODIUM-DEXTROSE 2-4 GM/100ML-% IV SOLN
INTRAVENOUS | Status: AC
  Filled 2017-08-12: qty 100

## 2017-08-12 MED ORDER — ACETAMINOPHEN 650 MG RE SUPP: 650.0000 mg | RECTAL | Status: DC | PRN

## 2017-08-12 MED ORDER — LIDOCAINE HCL (PF) 2 % IJ SOLN
INTRAMUSCULAR | Status: AC
  Filled 2017-08-12: qty 10

## 2017-08-12 MED ORDER — MAGNESIUM CITRATE PO SOLN
1.0000 | Freq: Once | ORAL | Status: AC | PRN
  Administered 2017-08-14: 1 via ORAL
  Filled 2017-08-12 (×2): qty 296

## 2017-08-12 MED ORDER — ZOLPIDEM TARTRATE 5 MG PO TABS: 5.0000 mg | ORAL_TABLET | Freq: Every evening | ORAL | Status: DC | PRN

## 2017-08-12 MED ORDER — FAMOTIDINE 20 MG PO TABS
20.0000 mg | ORAL_TABLET | Freq: Once | ORAL | Status: AC
  Administered 2017-08-12: 20 mg via ORAL

## 2017-08-12 MED ORDER — HYDROMORPHONE HCL 1 MG/ML IJ SOLN
0.5000 mg | Freq: Once | INTRAMUSCULAR | Status: AC
  Administered 2017-08-12: 0.5 mg via INTRAVENOUS
  Filled 2017-08-12: qty 1

## 2017-08-12 MED ORDER — BUPIVACAINE HCL (PF) 0.5 % IJ SOLN
INTRAMUSCULAR | Status: DC | PRN
  Administered 2017-08-12: 3 mL via INTRATHECAL

## 2017-08-12 MED ORDER — ALUM & MAG HYDROXIDE-SIMETH 200-200-20 MG/5ML PO SUSP: 30.0000 mL | ORAL | Status: DC | PRN

## 2017-08-12 MED ORDER — FERROUS SULFATE 325 (65 FE) MG PO TABS
325.0000 mg | ORAL_TABLET | Freq: Every day | ORAL | Status: DC
  Administered 2017-08-13 – 2017-08-14 (×2): 325 mg via ORAL
  Filled 2017-08-12 (×2): qty 1

## 2017-08-12 MED ORDER — OMEGA-3-ACID ETHYL ESTERS 1 G PO CAPS
1.0000 | ORAL_CAPSULE | Freq: Every day | ORAL | Status: DC
  Administered 2017-08-13 – 2017-08-14 (×2): 1 g via ORAL
  Filled 2017-08-12 (×5): qty 1

## 2017-08-12 MED ORDER — EPHEDRINE SULFATE 50 MG/ML IJ SOLN
25.0000 mg | Freq: Once | INTRAMUSCULAR | Status: AC
  Administered 2017-08-12: 25 mg via INTRAVENOUS

## 2017-08-12 MED ORDER — EPHEDRINE SULFATE 50 MG/ML IJ SOLN
INTRAMUSCULAR | Status: AC
  Administered 2017-08-12: 25 mg via INTRAVENOUS
  Filled 2017-08-12: qty 1

## 2017-08-12 SURGICAL SUPPLY — 51 items
BLADE SAW SAG 18.5X105 (BLADE) ×3 IMPLANT
BNDG COHESIVE 6X5 TAN STRL LF (GAUZE/BANDAGES/DRESSINGS) ×9 IMPLANT
CANISTER SUCT 1200ML W/VALVE (MISCELLANEOUS) ×3 IMPLANT
CAPT HIP TOTAL 3 ×3 IMPLANT
CATH TRAY METER 16FR LF (MISCELLANEOUS) ×3 IMPLANT
CHLORAPREP W/TINT 26ML (MISCELLANEOUS) ×3 IMPLANT
DRAPE C-ARM XRAY 36X54 (DRAPES) ×3 IMPLANT
DRAPE INCISE IOBAN 66X60 STRL (DRAPES) IMPLANT
DRAPE POUCH INSTRU U-SHP 10X18 (DRAPES) ×3 IMPLANT
DRAPE SHEET LG 3/4 BI-LAMINATE (DRAPES) ×9 IMPLANT
DRAPE TABLE BACK 80X90 (DRAPES) ×3 IMPLANT
DRESSING SURGICEL FIBRLLR 1X2 (HEMOSTASIS) ×2 IMPLANT
DRSG OPSITE POSTOP 4X8 (GAUZE/BANDAGES/DRESSINGS) ×6 IMPLANT
DRSG SURGICEL FIBRILLAR 1X2 (HEMOSTASIS) ×6
ELECT BLADE 6.5 EXT (BLADE) ×3 IMPLANT
ELECT REM PT RETURN 9FT ADLT (ELECTROSURGICAL) ×3
ELECTRODE REM PT RTRN 9FT ADLT (ELECTROSURGICAL) ×1 IMPLANT
EVACUATOR 1/8 PVC DRAIN (DRAIN) IMPLANT
GLOVE BIOGEL PI IND STRL 9 (GLOVE) ×1 IMPLANT
GLOVE BIOGEL PI INDICATOR 9 (GLOVE) ×2
GLOVE SURG SYN 9.0  PF PI (GLOVE) ×4
GLOVE SURG SYN 9.0 PF PI (GLOVE) ×2 IMPLANT
GOWN SRG 2XL LVL 4 RGLN SLV (GOWNS) ×1 IMPLANT
GOWN STRL NON-REIN 2XL LVL4 (GOWNS) ×2
GOWN STRL REUS W/ TWL LRG LVL3 (GOWN DISPOSABLE) ×1 IMPLANT
GOWN STRL REUS W/TWL LRG LVL3 (GOWN DISPOSABLE) ×2
HOLDER FOLEY CATH W/STRAP (MISCELLANEOUS) ×3 IMPLANT
HOOD PEEL AWAY FLYTE STAYCOOL (MISCELLANEOUS) ×3 IMPLANT
KIT PREVENA INCISION MGT 13 (CANNISTER) ×3 IMPLANT
MAT BLUE FLOOR 46X72 FLO (MISCELLANEOUS) ×3 IMPLANT
NDL SAFETY 18GX1.5 (NEEDLE) ×3 IMPLANT
NEEDLE SPNL 18GX3.5 QUINCKE PK (NEEDLE) ×3 IMPLANT
NS IRRIG 1000ML POUR BTL (IV SOLUTION) ×3 IMPLANT
PACK HIP COMPR (MISCELLANEOUS) ×3 IMPLANT
SOL PREP PVP 2OZ (MISCELLANEOUS) ×3
SOLUTION PREP PVP 2OZ (MISCELLANEOUS) ×1 IMPLANT
SPONGE DRAIN TRACH 4X4 STRL 2S (GAUZE/BANDAGES/DRESSINGS) ×3 IMPLANT
STAPLER SKIN PROX 35W (STAPLE) ×3 IMPLANT
STRAP SAFETY BODY (MISCELLANEOUS) ×3 IMPLANT
SUT DVC 2 QUILL PDO  T11 36X36 (SUTURE) ×2
SUT DVC 2 QUILL PDO T11 36X36 (SUTURE) ×1 IMPLANT
SUT SILK 0 (SUTURE) ×2
SUT SILK 0 30XBRD TIE 6 (SUTURE) ×1 IMPLANT
SUT V-LOC 90 ABS DVC 3-0 CL (SUTURE) ×3 IMPLANT
SUT VIC AB 1 CT1 36 (SUTURE) ×3 IMPLANT
SYR 20CC LL (SYRINGE) ×3 IMPLANT
SYR 30ML LL (SYRINGE) ×3 IMPLANT
SYR BULB IRRIG 60ML STRL (SYRINGE) ×3 IMPLANT
TAPE MICROFOAM 4IN (TAPE) ×3 IMPLANT
TOWEL OR 17X26 4PK STRL BLUE (TOWEL DISPOSABLE) ×3 IMPLANT
WND VAC CANISTER 500ML (MISCELLANEOUS) ×3 IMPLANT

## 2017-08-12 NOTE — Progress Notes (Signed)
Patient's BP 168/100, HR 86 Patient verbalizes pain 8/10. No relief with morphine. Dr Rudene Christians paged and received verbal orders for Dilaudid IV 0.5 mg one time dose. Repeat in 5 min if pain is not relieved.   Wynema Birch, RN

## 2017-08-12 NOTE — Transfer of Care (Signed)
Immediate Anesthesia Transfer of Care Note  Patient: Harold Nelson  Procedure(s) Performed: TOTAL HIP ARTHROPLASTY ANTERIOR APPROACH (Right Hip)  Patient Location: PACU  Anesthesia Type:General  Level of Consciousness: awake and patient cooperative  Airway & Oxygen Therapy: Patient Spontanous Breathing and Patient connected to face mask oxygen  Post-op Assessment: Report given to RN and Post -op Vital signs reviewed and stable  Post vital signs: Reviewed and stable  Last Vitals:  Vitals:   08/12/17 0826  BP: (!) 149/95  Pulse: 76  Resp: 16  Temp: 36.5 C  SpO2: 97%    Last Pain:  Vitals:   08/12/17 0826  TempSrc: Tympanic         Complications: No apparent anesthesia complications

## 2017-08-12 NOTE — Anesthesia Preprocedure Evaluation (Signed)
Anesthesia Evaluation  Patient identified by MRN, date of birth, ID band Patient awake    Reviewed: Allergy & Precautions, NPO status , Patient's Chart, lab work & pertinent test results  History of Anesthesia Complications Negative for: history of anesthetic complications  Airway Mallampati: II  TM Distance: >3 FB Neck ROM: Full    Dental  (+) Caps   Pulmonary neg pulmonary ROS, neg sleep apnea, neg COPD,    breath sounds clear to auscultation- rhonchi (-) wheezing      Cardiovascular Exercise Tolerance: Good (-) hypertension(-) CAD and (-) Past MI negative cardio ROS   Rhythm:Regular Rate:Normal - Systolic murmurs and - Diastolic murmurs    Neuro/Psych Ulnar neuropathy  Neuromuscular disease negative neurological ROS  negative psych ROS   GI/Hepatic negative GI ROS, Neg liver ROS,   Endo/Other  negative endocrine ROSneg diabetes  Renal/GU negative Renal ROS     Musculoskeletal negative musculoskeletal ROS (+)   Abdominal (+) - obese,   Peds  Hematology negative hematology ROS (+)   Anesthesia Other Findings Past Medical History: No date: Dislocated shoulder No date: Hemorrhoids No date: Hyperlipidemia No date: Shingles No date: Ulnar neuropathy   Reproductive/Obstetrics                             Anesthesia Physical  Anesthesia Plan  ASA: II  Anesthesia Plan:    Post-op Pain Management:    Induction: Intravenous  PONV Risk Score and Plan: 1  Airway Management Planned: Nasal Cannula  Additional Equipment:   Intra-op Plan:   Post-operative Plan:   Informed Consent: I have reviewed the patients History and Physical, chart, labs and discussed the procedure including the risks, benefits and alternatives for the proposed anesthesia with the patient or authorized representative who has indicated his/her understanding and acceptance.   Dental advisory given  Plan  Discussed with: CRNA and Surgeon  Anesthesia Plan Comments:         Anesthesia Quick Evaluation

## 2017-08-12 NOTE — Progress Notes (Signed)
PT Cancellation Note  Patient Details Name: Harold Nelson MRN: [id number] DOB: Jan 29, 1935   Cancelled Treatment:    Reason Eval/Treat Not Completed: Patient not medically ready Pt still having numbness from spinal at 1600, will hold PT exam today and start tomorrow.   Kreg Shropshire, DPT 08/12/2017, 4:06 PM

## 2017-08-12 NOTE — Anesthesia Procedure Notes (Signed)
Spinal  Patient location during procedure: OR Staffing Anesthesiologist: Carroll, Paul, MD Resident/CRNA: Gladiola Madore, CRNA Performed: resident/CRNA  Preanesthetic Checklist Completed: patient identified, site marked, surgical consent, pre-op evaluation, timeout performed, IV checked, risks and benefits discussed and monitors and equipment checked Spinal Block Patient position: sitting Prep: ChloraPrep and site prepped and draped Patient monitoring: heart rate, continuous pulse ox, blood pressure and cardiac monitor Approach: midline Location: L4-5 Injection technique: single-shot Needle Needle type: Introducer and Pencan  Needle gauge: 24 G Needle length: 9 cm Additional Notes Negative paresthesia. Negative blood return. Positive free-flowing CSF. Expiration date of kit checked and confirmed. Patient tolerated procedure well, without complications.       

## 2017-08-12 NOTE — Progress Notes (Signed)
PHARMACIST - PHYSICIAN ORDER COMMUNICATION  CONCERNING: P&T Medication Policy on Herbal Medications  DESCRIPTION:  This patient's order for:  coq10,tumeric  has been noted.  This product(s) is classified as an "herbal" or natural product. Due to a lack of definitive safety studies or FDA approval, nonstandard manufacturing practices, plus the potential risk of unknown drug-drug interactions while on inpatient medications, the Pharmacy and Therapeutics Committee does not permit the use of "herbal" or natural products of this type within [facility].   ACTION TAKEN: The pharmacy department is unable to verify this order at this time and your patient has been informed of this safety policy. Please reevaluate patient's clinical condition at discharge and address if the herbal or natural product(s) should be resumed at that time.   Ramond Dial, Pharm.D, BCPS Clinical Pharmacist

## 2017-08-12 NOTE — H&P (Signed)
Reviewed paper H+P, will be scanned into chart. No changes noted.  

## 2017-08-12 NOTE — Progress Notes (Signed)
Patient verbalizes pain 4/10. BP 148/85. HR 84. O2 100% on room air  Patient calm and resting.

## 2017-08-12 NOTE — NC FL2 (Signed)
[place] LEVEL OF CARE SCREENING TOOL     IDENTIFICATION  Patient Name: Harold Nelson Birthdate: 10-20-1934 Sex: male Admission Date (Current Location): 08/12/2017  [place] and [place] Number:  Engineering geologist and Address:  [facility], [address] [place], [place] [address]      Provider Number: [id number]  Attending Physician Name and Address:  Hessie Knows, MD  Relative Name and Phone Number:       Current Level of Care: Hospital Recommended Level of Care: [place] Prior Approval Number:    Date Approved/Denied:   PASRR Number: ([id number])  Discharge Plan: SNF    Current Diagnoses: Patient Active Problem List   Diagnosis Date Noted  . Primary localized osteoarthritis of right hip 08/12/2017    Orientation RESPIRATION BLADDER Height & Weight     Self, Time, Situation, Place  Normal Continent Weight:   Height:     BEHAVIORAL SYMPTOMS/MOOD NEUROLOGICAL BOWEL NUTRITION STATUS      Continent Diet(Diet: Clear Liquid to be [facility]. )  AMBULATORY STATUS COMMUNICATION OF NEEDS Skin   Extensive Assist Verbally Surgical wounds(Incision: Right Hip. )                       Personal Care Assistance Level of Assistance  Bathing, Feeding, Dressing Bathing Assistance: Limited assistance Feeding assistance: Independent Dressing Assistance: Limited assistance     Functional Limitations Info  Sight, Hearing, Speech Sight Info: Adequate Hearing Info: Adequate Speech Info: Adequate    SPECIAL CARE FACTORS FREQUENCY  PT (By licensed PT), OT (By licensed OT)     PT Frequency: (5) OT Frequency: (5)            Contractures      Additional Factors Info  Code Status, Allergies, Isolation Precautions Code Status Info: (Full Code. ) Allergies Info: (No Known Allergies. )     Isolation Precautions Info: (MRSA Nasal Swab. )     Current Medications (08/12/2017):  This is the current hospital  active medication list Current Facility-Administered Medications  Medication Dose Route Frequency Provider Last Rate Last Dose  . 0.9 %  sodium chloride infusion   Intravenous Continuous Hessie Knows, MD 100 mL/hr at 08/12/17 1428    . acetaminophen (TYLENOL) tablet 650 mg  650 mg Oral Q4H PRN Hessie Knows, MD       Or  . acetaminophen (TYLENOL) suppository 650 mg  650 mg Rectal Q4H PRN Hessie Knows, MD      . alum & mag hydroxide-simeth (MAALOX/MYLANTA) 200-200-20 MG/5ML suspension 30 mL  30 mL Oral Q4H PRN Hessie Knows, MD      . aspirin EC tablet 81 mg  81 mg Oral Daily Hessie Knows, MD      . bisacodyl (DULCOLAX) suppository 10 mg  10 mg Rectal Daily PRN Hessie Knows, MD      . ceFAZolin (ANCEF) 2 g in dextrose 5 % 100 mL IVPB  2 g Intravenous Q6H Hessie Knows, MD      . diphenhydrAMINE (BENADRYL) 12.5 MG/5ML elixir 12.5-25 mg  12.5-25 mg Oral Q4H PRN Hessie Knows, MD      . docusate sodium (COLACE) capsule 100 mg  100 mg Oral BID Hessie Knows, MD      . Derrill Memo ON 08/13/2017] enoxaparin (LOVENOX) injection 40 mg  40 mg Subcutaneous Q24H Hessie Knows, MD      . ferrous sulfate tablet 325 mg  325 mg Oral Daily Hessie Knows, MD      .  HYDROcodone-acetaminophen (NORCO/VICODIN) 5-325 MG per tablet 1 tablet  1 tablet Oral Q4H PRN Hessie Knows, MD   1 tablet at 08/12/17 1525  . magnesium citrate solution 1 Bottle  1 Bottle Oral Once PRN Hessie Knows, MD      . magnesium hydroxide (MILK OF MAGNESIA) suspension 30 mL  30 mL Oral Daily PRN Hessie Knows, MD      . Melatonin TABS 5 mg  1 tablet Oral QHS PRN Hessie Knows, MD      . menthol-cetylpyridinium (CEPACOL) lozenge 3 mg  1 lozenge Oral PRN Hessie Knows, MD       Or  . phenol (CHLORASEPTIC) mouth spray 1 spray  1 spray Mouth/Throat PRN Hessie Knows, MD      . methocarbamol (ROBAXIN) tablet 500 mg  500 mg Oral Q6H PRN Hessie Knows, MD       Or  . methocarbamol (ROBAXIN) 500 mg in dextrose 5 % 50 mL IVPB  500 mg Intravenous Q6H  PRN Hessie Knows, MD      . metoCLOPramide (REGLAN) tablet 5-10 mg  5-10 mg Oral Q8H PRN Hessie Knows, MD       Or  . metoCLOPramide (REGLAN) injection 5-10 mg  5-10 mg Intravenous Q8H PRN Hessie Knows, MD      . morphine 2 MG/ML injection 2 mg  2 mg Intravenous Q1H PRN Hessie Knows, MD      . multivitamin with minerals tablet 1 tablet  1 tablet Oral Daily Hessie Knows, MD      . omega-3 acid ethyl esters (LOVAZA) capsule 1 g  1 capsule Oral Daily Hessie Knows, MD      . ondansetron [facility]) tablet 4 mg  4 mg Oral Q6H PRN Hessie Knows, MD       Or  . ondansetron [facility]) injection 4 mg  4 mg Intravenous Q6H PRN Hessie Knows, MD      . oxyCODONE (Oxy IR/ROXICODONE) immediate release tablet 10 mg  10 mg Oral Q3H PRN Hessie Knows, MD      . timolol (BETIMOL) 0.5 % ophthalmic solution 1 drop  1 drop Right Eye Daily Hessie Knows, MD      . zolpidem (AMBIEN) tablet 5 mg  5 mg Oral QHS PRN Hessie Knows, MD         Discharge Medications: Please see discharge summary for a list of discharge medications.  Relevant Imaging Results:  Relevant Lab Results:   Additional Information (SSN: [id number])  Ahuva Poynor, Veronia Beets, LCSW

## 2017-08-12 NOTE — Op Note (Signed)
08/12/2017  11:34 AM  PATIENT:  Harold Nelson  81 y.o. male  PRE-OPERATIVE DIAGNOSIS:  PRIMARY OSTEOARTHRITIS OF RIGHT HIP  POST-OPERATIVE DIAGNOSIS:  PRIMARY OSTEOARTHRITIS OF RIGHT HIP  PROCEDURE:  Procedure(s): TOTAL HIP ARTHROPLASTY ANTERIOR APPROACH (Right)  SURGEON: Laurene Footman, MD  ASSISTANTS: none  ANESTHESIA:   spinal  EBL:  Total I/O In: -  Out: 650 [Urine:350; Blood:300]  BLOOD ADMINISTERED:none  DRAINS: none   LOCAL MEDICATIONS USED:  MARCAINE     SPECIMEN:  Source of Specimen:  right femoral head  DISPOSITION OF SPECIMEN:  PATHOLOGY  COUNTS:  YES  TOURNIQUET:  * No tourniquets in log *  IMPLANTS: Medacta AMIS 4 stem, Mpact DM 54 mm cup and liner, S 28 mm metal head  DICTATION: .Dragon Dictation   The patient was brought to the operating room and after spinal anesthesia was obtained patient was placed on the operative table with the ipsilateral foot into the Medacta attachment, contralateral leg on a well-padded table. C-arm was brought in and preop template x-ray taken. After prepping and draping in usual sterile fashion appropriate patient identification and timeout procedures were completed. Anterior approach to the hip was obtained and centered over the greater trochanter and TFL muscle. The subcutaneous tissue was incised hemostasis being achieved by electrocautery. TFL fascia was incised and the muscle retracted laterally deep retractor placed. The lateral femoral circumflex vessels were identified and ligated. The anterior capsule was exposed and a capsulotomy performed. The neck was identified and a femoral neck cut carried out with a saw. The head was removed without difficulty and showed sclerotic femoral head and acetabulum. Reaming was carried out to 54 mm and a 54 mm cup trial gave appropriate tightness to the acetabular component a 54 DM cup was impacted into position. The leg was then externally rotated and ischiofemoral and patellofemoral releases  carried out. The femur was sequentially broached to a size 4, size 4 standard with S head trials were placed and the final components chosen. The 4 standard stem was inserted along with a S 28 mm head and 54 mm liner. The hip was reduced and was stable the wound was thoroughly irrigated with a dilute Betadine solution. The deep fascia was closed using a heavy Quill after infiltration of 30 cc of quarter percent Sensorcaine with epinephrine. 3-0 v-loc to close the skin with skin staples Xeroform and honeycomb dressing  PLAN OF CARE: Admit to inpatient

## 2017-08-12 NOTE — Anesthesia Post-op Follow-up Note (Signed)
Anesthesia QCDR form completed.        

## 2017-08-13 LAB — GLUCOSE, CAPILLARY: GLUCOSE-CAPILLARY: 135 mg/dL — AB (ref 65–99)

## 2017-08-13 NOTE — Progress Notes (Signed)
Physical Therapy Treatment Patient Details Name: Harold Nelson MRN: [id number] DOB: 1935-01-10 Today's Date: 08/13/2017    History of Present Illness Pt admitted for R THR. Pt reports history of L THR 3 years ago.    PT Comments    Pt is making good progress towards goals with increased ambulation distance this session, meeting ambulation goal. Pt able to demonstrate good endurance with there-x and appears very motivated to perform therapy. Will need to perform stair training prior to dc. Will continue to progress.   Follow Up Recommendations  Home health PT     Equipment Recommendations  Rolling walker with 5" wheels    Recommendations for Other Services       Precautions / Restrictions Precautions Precautions: Fall;Anterior Hip Precaution Booklet Issued: Yes (comment) Restrictions Weight Bearing Restrictions: Yes RLE Weight Bearing: Weight bearing as tolerated    Mobility  Bed Mobility Overal bed mobility: Needs Assistance Bed Mobility: Sit to Supine       Sit to supine: Supervision   General bed mobility comments: safe technique performed with pt able to assist R LE with B UE.   Transfers Overall transfer level: Needs assistance Equipment used: Rolling walker (2 wheeled) Transfers: Sit to/from Stand Sit to Stand: Supervision         General transfer comment: safe technique with cues for sequencing. UPright posture noted  Ambulation/Gait Ambulation/Gait assistance: Min guard Ambulation Distance (Feet): 200 Feet Assistive device: Rolling walker (2 wheeled) Gait Pattern/deviations: Step-through pattern     General Gait Details: ambulated around RN station with reciprocal gait pattern and improved fluid gait. Increased fatigue noted with exertion and cues for obstacle avoidance.   Stairs            Wheelchair Mobility    Modified Rankin (Stroke Patients Only)       Balance                                             Cognition Arousal/Alertness: Awake/alert Behavior During Therapy: WFL for tasks assessed/performed Overall Cognitive Status: Within Functional Limits for tasks assessed                                        Exercises Other Exercises Other Exercises: supine ther-ex performed on R LE including ankle pumps, quad sets, glut sets, hip add squeezes, and hip abd/add. All ther-ex performed x 10 reps with cga. Other Exercises: Pt ambulated to bathroom, able to stand and use urinal. Supervision given and pt able to perform hygiene safely.    General Comments        Pertinent Vitals/Pain Pain Assessment: 0-10 Pain Score: 7  Pain Location: R hip Pain Descriptors / Indicators: Operative site guarding Pain Intervention(s): Limited activity within patient's tolerance    Home Living                      Prior Function            PT Goals (current goals can now be found in the care plan section) Acute Rehab PT Goals Patient Stated Goal: to [facility] PT Goal Formulation: With patient Time For Goal Achievement: 08/27/17 Potential to Achieve Goals: Good Progress towards PT goals: Progressing toward goals  Frequency    BID      PT Plan Current plan remains appropriate    Co-evaluation              AM-PAC PT "6 Clicks" Daily Activity  Outcome Measure  Difficulty turning over in bed (including adjusting bedclothes, sheets and blankets)?: Unable Difficulty moving from lying on back to sitting on the side of the bed? : Unable Difficulty sitting down on and standing up from a chair with arms (e.g., wheelchair, bedside commode, etc,.)?: Unable Help needed moving to and from a bed to chair (including a wheelchair)?: A Little Help needed walking in hospital room?: A Little Help needed climbing 3-5 steps with a railing? : A Lot 6 Click Score: 11    End of Session Equipment Utilized During Treatment: Gait belt Activity  Tolerance: Patient tolerated treatment well Patient left: in bed;with bed alarm set;with SCD's reapplied Nurse Communication: Mobility status PT Visit Diagnosis: Muscle weakness (generalized) (M62.81);Pain;Unsteadiness on feet (R26.81) Pain - Right/Left: Right Pain - part of body: Hip     Time: 7342-8768 PT Time Calculation (min) (ACUTE ONLY): 38 min  Charges:  $Gait Training: 8-22 mins $Therapeutic Exercise: 8-22 mins $Therapeutic Activity: 8-22 mins                    G Codes:  Functional Assessment Tool Used: AM-PAC 6 Clicks Basic Mobility Functional Limitation: Mobility: Walking and moving around Mobility: Walking and Moving Around Current Status (T1572): At least 60 percent but less than 80 percent impaired, limited or restricted Mobility: Walking and Moving Around Goal Status [phone]): At least 40 percent but less than 60 percent impaired, limited or restricted    Greggory Stallion, PT, DPT [phone]    Harold Nelson 08/13/2017, 4:01 PM

## 2017-08-13 NOTE — Evaluation (Signed)
Physical Therapy Evaluation Patient Details Name: Harold Nelson MRN: [id number] DOB: 06-Aug-1935 Today's Date: 08/13/2017   History of Present Illness  Pt admitted for R THR. Pt reports history of L THR 3 years ago.  Clinical Impression  Pt is a pleasant 82 year old male who was admitted for R THR. Pt performs bed mobility, transfers, and ambulation with cga and RW. Pt demonstrates deficits with strength/endurance/mobility. Pt appears very motivated to perform therapy. Would benefit from skilled PT to address above deficits and promote optimal return to PLOF. Recommend transition to [place] upon discharge from acute hospitalization.       Follow Up Recommendations Home health PT    Equipment Recommendations  Rolling walker with 5" wheels    Recommendations for Other Services       Precautions / Restrictions Precautions Precautions: Fall;Anterior Hip Precaution Booklet Issued: No Restrictions Weight Bearing Restrictions: Yes RLE Weight Bearing: Weight bearing as tolerated      Mobility  Bed Mobility Overal bed mobility: Needs Assistance Bed Mobility: Supine to Sit     Supine to sit: Min guard     General bed mobility comments: cues for sequencing and assist for trunkal elevation. Once seated at EOB, able to sit with upright posture  Transfers Overall transfer level: Needs assistance Equipment used: Rolling walker (2 wheeled) Transfers: Sit to/from Stand Sit to Stand: Min guard         General transfer comment: safe technique performed with correct use of RW.  Ambulation/Gait Ambulation/Gait assistance: Min guard Ambulation Distance (Feet): 40 Feet Assistive device: Rolling walker (2 wheeled) Gait Pattern/deviations: Step-through pattern     General Gait Details: ambulated towards bathroom with initial step to gait pattern, however progressing to reciprocal gait, although inconsistent. Upright posture noted, occasional cue to keep RW close to body  Stairs             Wheelchair Mobility    Modified Rankin (Stroke Patients Only)       Balance Overall balance assessment: Needs assistance Sitting-balance support: Feet supported Sitting balance-Leahy Scale: Good     Standing balance support: Bilateral upper extremity supported Standing balance-Leahy Scale: Good                               Pertinent Vitals/Pain Pain Assessment: 0-10 Pain Score: 6  Pain Location: R hip Pain Descriptors / Indicators: Operative site guarding Pain Intervention(s): Limited activity within patient's tolerance;Premedicated before session;Repositioned    Home Living Family/patient expects to be discharged to:: Private residence Living Arrangements: Spouse/significant other Available Help at Discharge: Family Type of Home: House Home Access: Stairs to enter Entrance Stairs-Rails: Right Entrance Stairs-Number of Steps: 2 Home Layout: One level        Prior Function Level of Independence: Independent         Comments: indep with all mobility prior to admission     Hand Dominance        Extremity/Trunk Assessment   Upper Extremity Assessment Upper Extremity Assessment: Overall WFL for tasks assessed    Lower Extremity Assessment Lower Extremity Assessment: Generalized weakness(R LE grossly 3/5; L LE grossly 4+/5)       Communication   Communication: No difficulties  Cognition Arousal/Alertness: Awake/alert Behavior During Therapy: WFL for tasks assessed/performed Overall Cognitive Status: Within Functional Limits for tasks assessed  General Comments      Exercises Other Exercises Other Exercises: supine ther-ex performed on R LE including ankle pumps, quad sets, glut sets, and hip abd/add. All ther-ex performed x 10 reps with cga. Other Exercises: Pt ambulated to bathroom, able to stand and use urinal. Supervision given and pt able to perform hygiene safely.    Assessment/Plan    PT Assessment Patient needs continued PT services  PT Problem List Decreased strength;Decreased activity tolerance;Decreased balance;Decreased mobility;Pain;Decreased knowledge of use of DME       PT Treatment Interventions DME instruction;Gait training;Stair training;Therapeutic exercise    PT Goals (Current goals can be found in the Care Plan section)  Acute Rehab PT Goals Patient Stated Goal: to go home and [facility] PT Goal Formulation: With patient Time For Goal Achievement: 08/27/17 Potential to Achieve Goals: Good    Frequency BID   Barriers to discharge        Co-evaluation               AM-PAC PT "6 Clicks" Daily Activity  Outcome Measure Difficulty turning over in bed (including adjusting bedclothes, sheets and blankets)?: Unable Difficulty moving from lying on back to sitting on the side of the bed? : Unable Difficulty sitting down on and standing up from a chair with arms (e.g., wheelchair, bedside commode, etc,.)?: Unable Help needed moving to and from a bed to chair (including a wheelchair)?: A Little Help needed walking in hospital room?: A Little Help needed climbing 3-5 steps with a railing? : A Lot 6 Click Score: 11    End of Session Equipment Utilized During Treatment: Gait belt Activity Tolerance: Patient tolerated treatment well Patient left: in chair;with chair alarm set;with family/visitor present;with SCD's reapplied Nurse Communication: Mobility status PT Visit Diagnosis: Muscle weakness (generalized) (M62.81);Pain;Unsteadiness on feet (R26.81) Pain - Right/Left: Right Pain - part of body: Hip    Time: 2549-8264 PT Time Calculation (min) (ACUTE ONLY): 31 min   Charges:   PT Evaluation $PT Eval Low Complexity: 1 Low PT Treatments $Therapeutic Exercise: 8-22 mins   PT G Codes:   PT G-Codes **NOT FOR INPATIENT CLASS** Functional Assessment Tool Used: AM-PAC 6 Clicks Basic Mobility Functional  Limitation: Mobility: Walking and moving around Mobility: Walking and Moving Around Current Status (B5830): At least 60 percent but less than 80 percent impaired, limited or restricted Mobility: Walking and Moving Around Goal Status [phone]): At least 40 percent but less than 60 percent impaired, limited or restricted    Greggory Stallion, PT, DPT [phone]   Valina Maes 08/13/2017, 11:22 AM

## 2017-08-13 NOTE — Anesthesia Postprocedure Evaluation (Signed)
Anesthesia Post Note  Patient: Harold Nelson  Procedure(s) Performed: TOTAL HIP ARTHROPLASTY ANTERIOR APPROACH (Right Hip)  Patient location during evaluation: Nursing Unit Anesthesia Type: Spinal Level of consciousness: awake and alert Pain management: pain level controlled Vital Signs Assessment: post-procedure vital signs reviewed and stable Respiratory status: respiratory function stable Cardiovascular status: stable Postop Assessment: no headache, no backache, spinal receding, patient able to bend at knees, no apparent nausea or vomiting and adequate PO intake Anesthetic complications: no     Last Vitals:  Vitals:   08/12/17 1802 08/12/17 2018  BP: (!) 148/85 112/62  Pulse: 84   Resp:  20  Temp:  36.7 C  SpO2: 100% 99%    Last Pain:  Vitals:   08/13/17 0634  TempSrc:   PainSc: Ambrose Mantle

## 2017-08-13 NOTE — Progress Notes (Signed)
   Subjective: 1 Day Post-Op Procedure(s) (LRB): TOTAL HIP ARTHROPLASTY ANTERIOR APPROACH (Right) Patient reports pain as mild.   Patient is well, and has had no acute complaints or problems Denies any CP, SOB, ABD pain. We will continue therapy today.  Plan is to go Home after hospital stay.  Objective: Vital signs in last 24 hours: Temp:  [97.5 F (36.4 C)-98 F (36.7 C)] 98 F (36.7 C) (11/06 2018) Pulse Rate:  [67-92] 84 (11/06 1802) Resp:  [10-22] 20 (11/06 2018) BP: (80-170)/(61-136) 112/62 (11/06 2018) SpO2:  [93 %-100 %] 99 % (11/06 2018) Weight:  [80.3 kg (177 lb)] 80.3 kg (177 lb) (11/06 1735)  Intake/Output from previous day: 11/06 0701 - 11/07 0700 In: 3263.3 [P.O.:480; I.V.:2583.3; IV Piggyback:200] Out: 3350 [Urine:3050; Blood:300] Intake/Output this shift: No intake/output data recorded.  Recent Labs    08/12/17 1412  HGB 13.4   Recent Labs    08/12/17 1412  WBC 8.9  RBC 4.41  HCT 39.9*  PLT 229   Recent Labs    08/12/17 1357  CREATININE 0.93   No results for input(s): LABPT, INR in the last 72 hours.  EXAM General - Patient is Alert, Appropriate and Oriented Extremity - Neurovascular intact Sensation intact distally Intact pulses distally Dorsiflexion/Plantar flexion intact No cellulitis present Compartment soft Dressing - dressing C/D/I and no drainage Motor Function - intact, moving foot and toes well on exam.   Past Medical History:  Diagnosis Date  . Dislocated shoulder   . Hemorrhoids   . Hyperlipidemia   . Shingles 2012  . Sleep apnea    No C-PAP, changed sleep position  . Ulnar neuropathy     Assessment/Plan:   1 Day Post-Op Procedure(s) (LRB): TOTAL HIP ARTHROPLASTY ANTERIOR APPROACH (Right) Active Problems:   Primary localized osteoarthritis of right hip  Estimated body mass index is 28.57 kg/m as calculated from the following:   Height as of this encounter: 5\' 6"  (1.676 m).   Weight as of this encounter: 80.3 kg  (177 lb). Advance diet Up with therapy  Needs BM Recheck labs in the am CM to assist with discharge  DVT Prophylaxis - Lovenox, Foot Pumps and TED hose Weight-Bearing as tolerated to right leg   T. Rachelle Hora, PA-C [place] 08/13/2017, 8:57 AM

## 2017-08-13 NOTE — Progress Notes (Signed)
Clinical Social Worker (CSW) received SNF consult. PT is recommending home health. RN case manager aware of above. Please reconsult if future social work needs arise. CSW signing off.   Joash Tony, LCSW [phone] 

## 2017-08-13 NOTE — Care Management Note (Signed)
Case Management Note  Patient Details  Name: Harold Nelson MRN: [id number] Date of Birth: 03-25-1935  Subjective/Objective:  POD # 1 right THA.  Met with patient and his wife at bedside to discuss discharge planning. Offered choice of home health agencies. Referral to [facility] for HHPT. Patient has a bsc but will need a walker. Ordered from [facility]. [facility]: [facility]- [phone]. Called Lovenox 40 mg # 14 no refills. PCP is  [place] [facility].                  Action/Plan: [facility] for walker. [facility] for [place]. Lovenox called in  Expected Discharge Date:                  Expected Discharge Plan:  [place]  In-House Referral:     Discharge planning Services  CM Consult  Post Acute Care Choice:  Durable Medical Equipment, Home Health Choice offered to:  Patient  DME Arranged:  Walker rolling DME Agency:  [place]:  PT [place] Agency:  [facility] ([facility]  Status of Service:  In process, will continue to follow  If discussed at Long Length of Stay Meetings, dates discussed:    Additional Comments:  Jolly Mango, RN 08/13/2017, 11:43 AM

## 2017-08-14 LAB — BASIC METABOLIC PANEL
ANION GAP: 6 (ref 5–15)
BUN: 13 mg/dL (ref 6–20)
CO2: 27 mmol/L (ref 22–32)
Calcium: 8 mg/dL — ABNORMAL LOW (ref 8.9–10.3)
Chloride: 101 mmol/L (ref 101–111)
Creatinine, Ser: 0.88 mg/dL (ref 0.61–1.24)
GFR calc Af Amer: >60 mL/min (ref >60)
GLUCOSE: 133 mg/dL — AB (ref 65–99)
POTASSIUM: 4.1 mmol/L (ref 3.5–5.1)
Sodium: 134 mmol/L — ABNORMAL LOW (ref 135–145)

## 2017-08-14 LAB — CBC
HEMATOCRIT: 38.1 % — AB (ref 40.0–52.0)
HEMOGLOBIN: 12.7 g/dL — AB (ref 13.0–18.0)
MCH: 30.1 pg (ref 26.0–34.0)
MCHC: 33.3 g/dL (ref 32.0–36.0)
MCV: 90.4 fL (ref 80.0–100.0)
Platelets: 196 10*3/uL (ref 150–440)
RBC: 4.22 MIL/uL — AB (ref 4.40–5.90)
RDW: 13.6 % (ref 11.5–14.5)
WBC: 8.4 10*3/uL (ref 3.8–10.6)

## 2017-08-14 LAB — GLUCOSE, CAPILLARY: Glucose-Capillary: 98 mg/dL (ref 65–99)

## 2017-08-14 LAB — SURGICAL PATHOLOGY

## 2017-08-14 MED ORDER — ENOXAPARIN SODIUM 40 MG/0.4ML ~~LOC~~ SOLN: 40.0000 mg | SUBCUTANEOUS | 0 refills | Status: DC

## 2017-08-14 MED ORDER — OXYCODONE HCL 5 MG PO TABS: 5.0000 mg | ORAL_TABLET | ORAL | 0 refills | Status: DC | PRN

## 2017-08-14 NOTE — Discharge Summary (Signed)
Physician Discharge Summary  Patient ID: Harold Nelson MRN: [id number] DOB/AGE: 1935/10/06 82 y.o.  Admit date: 08/12/2017 Discharge date: 08/14/2017  Admission Diagnoses:  PRIMARY OSTEOARTHRITIS OF RIGHT HIP   Discharge Diagnoses: Patient Active Problem List   Diagnosis Date Noted  . Primary localized osteoarthritis of right hip 08/12/2017    Past Medical History:  Diagnosis Date  . Dislocated shoulder   . Hemorrhoids   . Hyperlipidemia   . Shingles 2012  . Sleep apnea    No C-PAP, changed sleep position  . Ulnar neuropathy      Transfusion: non   Consultants (if any):   Discharged Condition: Improved  Hospital Course: Harold Nelson is an 82 y.o. male who was admitted 08/12/2017 with a diagnosis of right hip osteoarthritis and went to the operating room on 08/12/2017 and underwent the above named procedures.    Surgeries: Procedure(s): TOTAL HIP ARTHROPLASTY ANTERIOR APPROACH on 08/12/2017 Patient tolerated the surgery well. Taken to PACU where she was stabilized and then transferred to the orthopedic floor.  Started on Lovenox 40 q 23 hrs. Foot pumps applied bilaterally at 80 mm. Heels elevated on bed with rolled towels. No evidence of DVT. Negative Homan. Physical therapy started on day #1 for gait training and transfer. OT started day #1 for ADL and assisted devices.  Patient's foley was d/c on day #1. Patient's IV  was d/c on day #2.  On post op day #2 patient was stable and ready for discharge to home with home health physical therapy.  Implants: Medacta AMIS 4 stem, Mpact DM 54 mm cup and liner, S 28 mm metal head    He was given perioperative antibiotics:  Anti-infectives (From admission, onward)   Start     Dose/Rate Route Frequency Ordered Stop   08/12/17 1530  ceFAZolin (ANCEF) 2 g in dextrose 5 % 100 mL IVPB     2 g 200 mL/hr over 30 Minutes Intravenous Every 6 hours 08/12/17 1412 08/13/17 0516   08/12/17 1345  ceFAZolin (ANCEF) IVPB 2g/100 mL premix   Status:  Discontinued     2 g 200 mL/hr over 30 Minutes Intravenous Every 6 hours 08/12/17 1342 08/12/17 1412   08/12/17 0951  ceFAZolin (ANCEF) 2-4 GM/100ML-% IVPB    Comments:  Norton Blizzard  : cabinet override      08/12/17 0951 08/12/17 1022   08/11/17 2145  vancomycin (VANCOCIN) IVPB 1000 mg/200 mL premix     1,000 mg 200 mL/hr over 60 Minutes Intravenous  Once 08/11/17 2133 08/12/17 1000   08/11/17 2145  ceFAZolin (ANCEF) IVPB 2g/100 mL premix     2 g 200 mL/hr over 30 Minutes Intravenous  Once 08/11/17 2133 08/12/17 1052    .  He was given sequential compression devices, early ambulation, and Lovenox for DVT prophylaxis.  He benefited maximally from the hospital stay and there were no complications.    Recent vital signs:  Vitals:   08/13/17 1657 08/13/17 1940  BP: 119/77 102/78  Pulse: 87 96  Resp:  17  Temp:  98.8 F (37.1 C)  SpO2:  91%    Recent laboratory studies:  Lab Results  Component Value Date   HGB 12.7 (L) 08/14/2017   HGB 13.4 08/12/2017   HGB 15.3 07/31/2017   Lab Results  Component Value Date   WBC 8.4 08/14/2017   PLT 196 08/14/2017   Lab Results  Component Value Date   INR 0.88 07/31/2017   Lab Results  Component Value Date  NA 134 (L) 08/14/2017   K 4.1 08/14/2017   CL 101 08/14/2017   CO2 27 08/14/2017   BUN 13 08/14/2017   CREATININE 0.88 08/14/2017   GLUCOSE 133 (H) 08/14/2017    Discharge Medications:   Allergies as of 08/14/2017   No Known Allergies     Medication List    TAKE these medications   aspirin 81 MG tablet Take 81 mg by mouth daily.   Coenzyme Q10 100 MG capsule Take 1 capsule by mouth daily.   enoxaparin 40 MG/0.4ML injection Commonly known as:  LOVENOX Inject 0.4 mLs (40 mg total) daily into the skin.   ferrous sulfate 325 (65 FE) MG tablet Take 325 mg by mouth daily.   Melatonin 5 MG Caps Take 1 capsule by mouth at bedtime as needed (sleep).   MENS PROSTATE HEALTH FORMULA PO Take 1  capsule by mouth daily.   multivitamin tablet Take 1 tablet by mouth daily.   OMEGA-3 FISH OIL PO Take 1 capsule by mouth daily.   oxyCODONE 5 MG immediate release tablet Commonly known as:  Oxy IR/ROXICODONE Take 1-2 tablets (5-10 mg total) every 4 (four) hours as needed by mouth for severe pain ((score 7 to 10)).   Red Yeast Rice Extract 600 MG Caps Take 2 capsules by mouth daily.   timolol 0.5 % ophthalmic solution Commonly known as:  BETIMOL Place 1 drop into the right eye daily.   Turmeric 450 MG Caps Take 1 capsule by mouth daily.       Diagnostic Studies: Dg Hip Operative Unilat W Or W/o Pelvis Right  Result Date: 08/12/2017 CLINICAL DATA:  82 year old male for hip replacement. Initial encounter. EXAM: OPERATIVE right HIP (WITH PELVIS IF PERFORMED) 3 VIEWS TECHNIQUE: Fluoroscopic spot image(s) were submitted for interpretation post-operatively. COMPARISON:  None. FINDINGS: Post right hip replacement. Only portion visualized on the frontal projection without complication noted. Prosthesis can be assessed on follow-up two-view exam. IMPRESSION: Post right hip replacement incompletely assessed on the present exam. Electronically Signed   By: Genia Del M.D.   On: 08/12/2017 11:35   Dg Hip Unilat W Or W/o Pelvis 2-3 Views Right  Result Date: 08/12/2017 CLINICAL DATA:  Status post total hip replacement with pain EXAM: DG HIP (WITH OR WITHOUT PELVIS) 2-3V RIGHT COMPARISON:  None. FINDINGS: Frontal and lateral views were obtained. There is a total hip replacement right with prosthetic components well-seated. No fracture or dislocation. There are overlying skin staples. IMPRESSION: Total hip replacement with prosthetic components well-seated. No fracture or dislocation. Electronically Signed   By: Lowella Grip III M.D.   On: 08/12/2017 12:29    Disposition: 01-Home or Self Care    Follow-up Information    Hessie Knows, MD Follow up in 2 week(s).   Specialty:   Orthopedic Surgery Contact information: [address] [place] [address] [phone]            Signed: Dorise Hiss [facility] 08/14/2017, 8:47 AM

## 2017-08-14 NOTE — Care Management (Signed)
Spoke patient regarding discharge to home today. He had a new walker delivered however he states it is very difficult to open and requests a better one. Corene Cornea with [facility] notified of this need. Lovenox $10. [facility] confirmed with patient and he specifically wants Waldon Reining for [place] which has been sent to [place] with [facility]. No other RNCM needs.

## 2017-08-14 NOTE — Discharge Instructions (Signed)

## 2017-08-14 NOTE — Discharge Planning (Signed)
Patient's IV removed.  RN assessment and VS revealed stability for DC to home.  Discharge papers printed, explained and educated.  Informed of suggested FU appts and appts made.  Scripts signed and given  To patients.  Once ready, will be wheeled to front and family transporting home via car.

## 2017-08-14 NOTE — Progress Notes (Signed)
   Subjective: 2 Days Post-Op Procedure(s) (LRB): TOTAL HIP ARTHROPLASTY ANTERIOR APPROACH (Right) Patient reports pain as mild.   Patient is well, and has had no acute complaints or problems Denies any CP, SOB, ABD pain. We will continue therapy today.  Plan is to go Home after hospital stay.  Objective: Vital signs in last 24 hours: Temp:  [98.8 F (37.1 C)-99.9 F (37.7 C)] 98.8 F (37.1 C) (11/07 1940) Pulse Rate:  [87-96] 96 (11/07 1940) Resp:  [16-17] 17 (11/07 1940) BP: (102-149)/(77-129) 102/78 (11/07 1940) SpO2:  [91 %-95 %] 91 % (11/07 1940)  Intake/Output from previous day: 11/07 0701 - 11/08 0700 In: -  Out: 1640 [Urine:1640] Intake/Output this shift: Total I/O In: -  Out: 900 [Urine:900]  Recent Labs    08/12/17 1412 08/14/17 0417  HGB 13.4 12.7*   Recent Labs    08/12/17 1412 08/14/17 0417  WBC 8.9 8.4  RBC 4.41 4.22*  HCT 39.9* 38.1*  PLT 229 196   Recent Labs    08/12/17 1357 08/14/17 0417  NA  --  134*  K  --  4.1  CL  --  101  CO2  --  27  BUN  --  13  CREATININE 0.93 0.88  GLUCOSE  --  133*  CALCIUM  --  8.0*   No results for input(s): LABPT, INR in the last 72 hours.  EXAM General - Patient is Alert, Appropriate and Oriented Extremity - Neurovascular intact Sensation intact distally Intact pulses distally Dorsiflexion/Plantar flexion intact No cellulitis present Compartment soft Dressing - dressing C/D/I and no drainage Motor Function - intact, moving foot and toes well on exam.   Past Medical History:  Diagnosis Date  . Dislocated shoulder   . Hemorrhoids   . Hyperlipidemia   . Shingles 2012  . Sleep apnea    No C-PAP, changed sleep position  . Ulnar neuropathy     Assessment/Plan:   2 Days Post-Op Procedure(s) (LRB): TOTAL HIP ARTHROPLASTY ANTERIOR APPROACH (Right) Active Problems:   Primary localized osteoarthritis of right hip  Estimated body mass index is 28.57 kg/m as calculated from the following:  Height as of this encounter: 5\' 6"  (1.676 m).   Weight as of this encounter: 80.3 kg (177 lb). Advance diet Up with therapy  Discharge home with home health PT today pending bowel movement Follow-up with [facility] in 2 weeks.  DVT Prophylaxis - Lovenox, Foot Pumps and TED hose Weight-Bearing as tolerated to right leg   T. Rachelle Hora, PA-C [place] 08/14/2017, 8:44 AM

## 2017-08-14 NOTE — Progress Notes (Signed)
Physical Therapy Treatment Patient Details Name: Harold Nelson MRN: [id number] DOB: [birth year] Today's Date: 08/14/2017    History of Present Illness Pt admitted for R THR. Pt reports history of L THR 3 years ago.    PT Comments    Pt has met goals for discharge. Pt navigated stairs this session with safe technique. Demonstrated sequencing prior to performance. Good endurance with ambulation this date with occasional cues for sequencing of RW. Pt very motivated to perform therapy, reviewed HEP including frequency and duration. Will sign off at this time.   Follow Up Recommendations  Home health PT     Equipment Recommendations  Rolling walker with 5" wheels    Recommendations for Other Services       Precautions / Restrictions Precautions Precautions: Fall;Anterior Hip Precaution Booklet Issued: Yes (comment) Restrictions Weight Bearing Restrictions: Yes RLE Weight Bearing: Weight bearing as tolerated    Mobility  Bed Mobility Overal bed mobility: Needs Assistance Bed Mobility: Supine to Sit     Supine to sit: Min guard     General bed mobility comments: Safe technique. Slight assistance guiding R LE to EOB  Transfers Overall transfer level: Needs assistance Equipment used: Rolling walker (2 wheeled) Transfers: Sit to/from Stand Sit to Stand: Supervision         General transfer comment: safe technique performed with upright posture noted. 1 cue to not pull on RW  Ambulation/Gait Ambulation/Gait assistance: Min guard Ambulation Distance (Feet): 200 Feet Assistive device: Rolling walker (2 wheeled) Gait Pattern/deviations: Step-through pattern     General Gait Details: ambulated in hallway to therapy gym and back. Reciprocal gait pattern noted with upright posture. Pt fatigues with increased distance this session. 1 seated rest break prior to stair training.    Stairs Stairs: Yes   Stair Management: One rail Right Number of Stairs: 4 General stair  comments: ambulated up/down stairs with step to gait pattern. Demonstrated technique prior to performance.  Wheelchair Mobility    Modified Rankin (Stroke Patients Only)       Balance                                            Cognition Arousal/Alertness: Awake/alert Behavior During Therapy: WFL for tasks assessed/performed Overall Cognitive Status: Within Functional Limits for tasks assessed                                        Exercises Other Exercises Other Exercises: supine ther-ex performed on R LE including ankle pumps, quad sets, glut sets, hip abd/add, heel slides, and SAQ. ALl ther-ex performed x 12 reps on R LE with safe technique. CGA given Other Exercises: Pt ambulated to bathroom, able to stand and use urinal. Supervision given and pt able to perform hygiene safely.    General Comments        Pertinent Vitals/Pain Pain Assessment: 0-10 Pain Score: 5  Pain Location: R hip Pain Descriptors / Indicators: Operative site guarding Pain Intervention(s): Limited activity within patient's tolerance;Patient requesting pain meds-RN notified;RN gave pain meds during session    Home Living                      Prior Function            PT Goals (  current goals can now be found in the care plan section) Acute Rehab PT Goals Patient Stated Goal: to go home and have [facility] therapy PT Goal Formulation: With patient Time For Goal Achievement: 08/27/17 Potential to Achieve Goals: Good Progress towards PT goals: Progressing toward goals    Frequency    BID      PT Plan Current plan remains appropriate    Co-evaluation              AM-PAC PT "6 Clicks" Daily Activity  Outcome Measure  Difficulty turning over in bed (including adjusting bedclothes, sheets and blankets)?: Unable Difficulty moving from lying on back to sitting on the side of the bed? : Unable Difficulty sitting down on and standing up from a  chair with arms (e.g., wheelchair, bedside commode, etc,.)?: Unable Help needed moving to and from a bed to chair (including a wheelchair)?: A Little Help needed walking in hospital room?: A Little Help needed climbing 3-5 steps with a railing? : A Little 6 Click Score: 12    End of Session Equipment Utilized During Treatment: Gait belt Activity Tolerance: Patient tolerated treatment well Patient left: in chair;with chair alarm set;with SCD's reapplied;with family/visitor present Nurse Communication: Mobility status PT Visit Diagnosis: Muscle weakness (generalized) (M62.81);Pain;Unsteadiness on feet (R26.81) Pain - Right/Left: Right Pain - part of body: Hip     Time: 1594-5859 PT Time Calculation (min) (ACUTE ONLY): 40 min  Charges:  $Gait Training: 8-22 mins $Therapeutic Exercise: 8-22 mins $Therapeutic Activity: 8-22 mins                    G Codes:  Functional Assessment Tool Used: AM-PAC 6 Clicks Basic Mobility Functional Limitation: Mobility: Walking and moving around Mobility: Walking and Moving Around Current Status ([id number]): At least 60 percent but less than 80 percent impaired, limited or restricted Mobility: Walking and Moving Around Goal Status [phone]): At least 40 percent but less than 60 percent impaired, limited or restricted    Greggory Stallion, PT, DPT [phone]    Harold Nelson 08/14/2017, 11:13 AM

## 2017-08-18 NOTE — H&P (Signed)
Chief Complaint: Chief Complaint  Patient presents with  . Hip Pain  wants to discuss his MRSA before surgery   Harold Nelson is a 82 y.o. male who presents today to discuss future total hip arthroplasty. Patient has scheduled total hip arthroplasty next week. Patient wanted to let us know about a history of MRSA infection on his right buttocks. Patient states 6-8 months ago he had a area of redness and swelling that was persistent for 3-4 weeks. After antibiotics this resolved. He describes this as a infected hair follicle. He also has a cystic structure along the gluteal cleft consistent with a pilonidal cyst that has been present since earlier this year. He has not needed antibiotics for this cystic structure. There is been no drainage warmth redness or pain. Patient wanted to make [place] aware of this past medical history. Patient currently is without any tender swollen erythematous lesions on his skin. He denies any fevers.  Past Medical History: Past Medical History:  Diagnosis Date  . Chicken pox  . Dislocated shoulder  . Hemorrhoids  . Hyperlipidemia  . Hyperplastic colon polyp 05/07/2016  . Shingles  . Tubular adenoma of colon, unspecified 05/07/2016  . Ulnar neuropathy   Past Surgical History: Past Surgical History:  Procedure Laterality Date  . Back surgery - Microdisectomy 1985  . Broken right elbow 1948  . COLONOSCOPY 05/06/2006  . COLONOSCOPY 05/07/2016  Tubular adenoma of colon/Hyperplastic colon polyp/Repeat 29yrs/MUS  . Dislocated shoulder 1966  . Left hip osteoarthritis, possible avascular necrosis Left 08/19/14  . Left knee arthroscopy 01/16/2006  . Left ulnar nerve surger 03/2004  . Removal of benign growth 1958  behind his ear   Past Family History: Family History  Problem Relation Age of Onset  . High blood pressure (Hypertension) Mother  . Brain cancer Mother  . Osteoporosis (Thinning of bones) Mother  . Other Father  Benign prostatic hypertrophy    Medications: Current Outpatient Medications Ordered in Epic  Medication Sig Dispense Refill  . amoxicillin (AMOXIL) 500 MG capsule Only before dental appointments  . aspirin 81 MG EC tablet Take 81 mg by mouth once daily.  . coenzyme Q10 400 mg Cap Take 1 capsule by mouth once daily.  . ferrous sulfate 325 (65 FE) MG tablet Take 325 mg by mouth daily with breakfast.  . magnesium 200 mg Take by mouth.  . melatonin 5 mg Tab Take by mouth.  . multivitamin tablet Take 1 tablet by mouth once daily.  Marland Kitchen omega-3 fatty acids/fish oil 340-1,000 mg capsule Take 1 capsule by mouth once daily.  . red yeast rice 600 mg Cap Take 1 capsule by mouth once daily.  . SAW/PYGEUM/NETTLE/PUMPKN/AA#17 (PROSTATE HEALTH FORMULA ORAL) Take 1 capsule by mouth once daily.  . timolol (BETIMOL) 0.25 % ophthalmic solution 1-2 drops 2 (two) times daily   No current Epic-ordered facility-administered medications on file.   Allergies: No Known Allergies   Review of Systems:  A comprehensive 14 point ROS was performed, reviewed by me today, and the pertinent orthopaedic findings are documented in the HPI.  Exam: BP 122/76 (BP Location: Left upper arm, Patient Position: Sitting, BP Cuff Size: Adult)  Ht 167.6 cm (5\' 6" )  Wt 82.5 kg (181 lb 14.4 oz)  BMI 29.36 kg/m  General/Constitutional: The patient appears to be well-nourished, well-developed, and in no acute distress. Neuro/Psych: Normal mood and affect, oriented to person, place and time. Eyes: Non-icteric. Pupils are equal, round, and reactive to light, and exhibit synchronous movement.  ENT: Unremarkable. Lymphatic: No palpable adenopathy. Respiratory: Non-labored breathing Cardiovascular: No edema, swelling or tenderness, except as noted in detailed exam. Integumentary: No impressive skin lesions present, except as noted in detailed exam. Musculoskeletal: Unremarkable, except as noted in detailed exam.  Examination of the buttocks shows 2 cm diameter  cystic structure that is nontender, nonerythematous along the gluteal cleft. There is no surrounding erythema warmth redness or tenderness. There is no drainage. There is no sign of induration or abscess on the buttocks.  Impression: Primary osteoarthritis of right hip [M16.11] Primary osteoarthritis of right hip (primary encounter diagnosis)  Plan:  1. Discussed patient's past medical history of infected hair follicle and pilonidal cyst. Patient currently without any signs of infected cyst or abscess. Will proceed with total hip arthroplasty and treat with preoperative and postoperative antibiotics as planned.  This note was generated in part with voice recognition software and I apologize for any typographical errors that were not detected and corrected.  Feliberto Gottron MPA-C    Electronically Signed by Feliberto Gottron, PA on 08/07/2017 9:15 AM EDT

## 2018-12-05 DIAGNOSIS — G5622 Lesion of ulnar nerve, left upper limb: Secondary | ICD-10-CM | POA: Insufficient documentation

## 2018-12-05 DIAGNOSIS — M19022 Primary osteoarthritis, left elbow: Secondary | ICD-10-CM | POA: Insufficient documentation

## 2019-11-04 ENCOUNTER — Ambulatory Visit: Payer: No Typology Code available for payment source

## 2019-11-13 ENCOUNTER — Ambulatory Visit: Payer: No Typology Code available for payment source

## 2020-01-26 ENCOUNTER — Other Ambulatory Visit: Payer: Self-pay

## 2020-01-26 ENCOUNTER — Ambulatory Visit (INDEPENDENT_AMBULATORY_CARE_PROVIDER_SITE_OTHER): Payer: Medicare Other | Admitting: Dermatology

## 2020-01-26 DIAGNOSIS — L089 Local infection of the skin and subcutaneous tissue, unspecified: Secondary | ICD-10-CM | POA: Diagnosis not present

## 2020-01-26 DIAGNOSIS — L820 Inflamed seborrheic keratosis: Secondary | ICD-10-CM | POA: Diagnosis not present

## 2020-01-26 DIAGNOSIS — L570 Actinic keratosis: Secondary | ICD-10-CM

## 2020-01-26 DIAGNOSIS — L578 Other skin changes due to chronic exposure to nonionizing radiation: Secondary | ICD-10-CM

## 2020-01-26 NOTE — Progress Notes (Signed)
   Follow-Up Visit   Subjective  Harold Nelson is a 85 y.o. male who presents for the following: Actinic Keratosis (scalp, face 5m f/u PDT with ALA to scalp 11/29/19) and check spot (irritated spot buttocks, > 26yr, uses vaseline).   The following portions of the chart were reviewed this encounter and updated as appropriate:  Tobacco  Allergies  Meds  Problems  Med Hx  Surg Hx  Fam Hx      Review of Systems:  No other skin or systemic complaints except as noted in HPI or Assessment and Plan.  Objective  Well appearing patient in no apparent distress; mood and affect are within normal limits.  A focused examination was performed including face, scalp, buttocks. Relevant physical exam findings are noted in the Assessment and Plan.  Objective  Scalp/face x 9 (9): Pink scaly macules   Objective  Scalp x 5, L clavicle x 1 (6): Erythematous keratotic or waxy stuck-on papule or plaque.    Assessment & Plan    Actinic Damage - diffuse scaly erythematous macules with underlying dyspigmentation - Recommend daily broad spectrum sunscreen SPF 30+ to sun-exposed areas, reapply every 2 hours as needed.  - Call for new or changing lesions.  Pressure Inflammation Buttocks -Discussed can progress to decubitus ulcer but is not now. -Discussed moisturizer.  AK (actinic keratosis) (9) Scalp/face x 9  Destruction of lesion - Scalp/face x 9 Complexity: simple   Destruction method: cryotherapy   Informed consent: discussed and consent obtained   Timeout:  patient name, date of birth, surgical site, and procedure verified Lesion destroyed using liquid nitrogen: Yes   Region frozen until ice ball extended beyond lesion: Yes   Outcome: patient tolerated procedure well with no complications   Post-procedure details: wound care instructions given    Inflamed seborrheic keratosis (6) Scalp x 5, L clavicle x 1  Destruction of lesion - Scalp x 5, L clavicle x 1 Complexity: simple     Destruction method: cryotherapy   Informed consent: discussed and consent obtained   Timeout:  patient name, date of birth, surgical site, and procedure verified Lesion destroyed using liquid nitrogen: Yes   Region frozen until ice ball extended beyond lesion: Yes   Outcome: patient tolerated procedure well with no complications   Post-procedure details: wound care instructions given    Return in about 6 months (around 07/27/2020) for UBSE.    Documentation: I have reviewed the above documentation for accuracy and completeness, and I agree with the above.  Sarina Ser, MD   I, Othelia Pulling, RMA, am acting as scribe for Sarina Ser, MD .

## 2020-01-26 NOTE — Patient Instructions (Signed)

## 2020-01-27 ENCOUNTER — Encounter: Payer: Self-pay | Admitting: Dermatology

## 2020-08-02 ENCOUNTER — Encounter: Payer: Self-pay | Admitting: Dermatology

## 2020-08-02 ENCOUNTER — Ambulatory Visit (INDEPENDENT_AMBULATORY_CARE_PROVIDER_SITE_OTHER): Payer: Medicare Other | Admitting: Dermatology

## 2020-08-02 ENCOUNTER — Other Ambulatory Visit: Payer: Self-pay

## 2020-08-02 DIAGNOSIS — L820 Inflamed seborrheic keratosis: Secondary | ICD-10-CM | POA: Diagnosis not present

## 2020-08-02 DIAGNOSIS — Z1283 Encounter for screening for malignant neoplasm of skin: Secondary | ICD-10-CM

## 2020-08-02 DIAGNOSIS — L814 Other melanin hyperpigmentation: Secondary | ICD-10-CM | POA: Diagnosis not present

## 2020-08-02 DIAGNOSIS — L570 Actinic keratosis: Secondary | ICD-10-CM

## 2020-08-02 DIAGNOSIS — L821 Other seborrheic keratosis: Secondary | ICD-10-CM | POA: Diagnosis not present

## 2020-08-02 DIAGNOSIS — D229 Melanocytic nevi, unspecified: Secondary | ICD-10-CM | POA: Diagnosis not present

## 2020-08-02 DIAGNOSIS — L578 Other skin changes due to chronic exposure to nonionizing radiation: Secondary | ICD-10-CM

## 2020-08-02 DIAGNOSIS — C44529 Squamous cell carcinoma of skin of other part of trunk: Secondary | ICD-10-CM

## 2020-08-02 DIAGNOSIS — D1800 Hemangioma unspecified site: Secondary | ICD-10-CM

## 2020-08-02 DIAGNOSIS — D492 Neoplasm of unspecified behavior of bone, soft tissue, and skin: Secondary | ICD-10-CM

## 2020-08-02 NOTE — Progress Notes (Signed)
Follow-Up Visit   Subjective  Harold Nelson is a 85 y.o. male who presents for the following: Annual Exam (UBSE, 6 months f/u UBSE ) and Actinic Keratosis (hx of AKs face and scalp ). The patient presents for Upper Body Skin Exam (UBSE) for skin cancer screening and mole check.  The following portions of the chart were reviewed this encounter and updated as appropriate:  Tobacco  Allergies  Meds  Problems  Med Hx  Surg Hx  Fam Hx     Review of Systems:  No other skin or systemic complaints except as noted in HPI or Assessment and Plan.  Objective  Well appearing patient in no apparent distress; mood and affect are within normal limits.  All skin waist up examined.  Objective  scalp, ears, face (21): Erythematous thin papules/macules with gritty scale.   Objective  Left Forearm - Anterior: Erythematous keratotic or waxy stuck-on papule or plaque.   Objective  superior sternum: 1.0 cm crusted pink patch    Assessment & Plan  AK (actinic keratosis) (21) scalp, ears, face  Destruction of lesion - scalp, ears, face Complexity: simple   Destruction method: cryotherapy   Informed consent: discussed and consent obtained   Timeout:  patient name, date of birth, surgical site, and procedure verified Lesion destroyed using liquid nitrogen: Yes   Region frozen until ice ball extended beyond lesion: Yes   Outcome: patient tolerated procedure well with no complications   Post-procedure details: wound care instructions given    Inflamed seborrheic keratosis Left Forearm - Anterior  Destruction of lesion - Left Forearm - Anterior Complexity: simple   Destruction method: cryotherapy   Informed consent: discussed and consent obtained   Timeout:  patient name, date of birth, surgical site, and procedure verified Lesion destroyed using liquid nitrogen: Yes   Region frozen until ice ball extended beyond lesion: Yes   Outcome: patient tolerated procedure well with no  complications   Post-procedure details: wound care instructions given    Neoplasm of skin superior sternum  Epidermal / dermal shaving  Lesion diameter (cm):  1 Informed consent: discussed and consent obtained   Timeout: patient name, date of birth, surgical site, and procedure verified   Procedure prep:  Patient was prepped and draped in usual sterile fashion Prep type:  Isopropyl alcohol Anesthesia: the lesion was anesthetized in a standard fashion   Anesthetic:  1% lidocaine w/ epinephrine 1-100,000 buffered w/ 8.4% NaHCO3 Hemostasis achieved with: pressure, aluminum chloride and electrodesiccation   Outcome: patient tolerated procedure well   Post-procedure details: sterile dressing applied and wound care instructions given   Dressing type: bandage and petrolatum    Destruction of lesion Complexity: extensive   Destruction method: electrodesiccation and curettage   Informed consent: discussed and consent obtained   Timeout:  patient name, date of birth, surgical site, and procedure verified Procedure prep:  Patient was prepped and draped in usual sterile fashion Prep type:  Isopropyl alcohol Anesthesia: the lesion was anesthetized in a standard fashion   Anesthetic:  1% lidocaine w/ epinephrine 1-100,000 buffered w/ 8.4% NaHCO3 Curettage performed in three different directions: Yes   Electrodesiccation performed over the curetted area: Yes   Lesion length (cm):  1 Lesion width (cm):  1 Margin per side (cm):  0.2 Final wound size (cm):  1.4 Hemostasis achieved with:  pressure, aluminum chloride and electrodesiccation Outcome: patient tolerated procedure well with no complications   Post-procedure details: sterile dressing applied and wound care instructions given  Dressing type: bandage and petrolatum    Specimen 1 - Surgical pathology Differential Diagnosis: R/O SCC Check Margins: No 1.0 cm crusted pink patch   Lentigines - Scattered tan macules - Discussed due to  sun exposure - Benign, observe - Call for any changes  Seborrheic Keratoses - Stuck-on, waxy, tan-brown papules and plaques  - Discussed benign etiology and prognosis. - Observe - Call for any changes  Melanocytic Nevi - Tan-brown and/or pink-flesh-colored symmetric macules and papules - Benign appearing on exam today - Observation - Call clinic for new or changing moles - Recommend daily use of broad spectrum spf 30+ sunscreen to sun-exposed areas.   Hemangiomas - Red papules - Discussed benign nature - Observe - Call for any changes  Actinic Damage -chronic - diffuse scaly erythematous macules with underlying dyspigmentation - Recommend daily broad spectrum sunscreen SPF 30+ to sun-exposed areas, reapply every 2 hours as needed.  - Call for new or changing lesions.  Skin cancer screening performed today.  Return in about 6 months (around 01/31/2021).  IMarye Round, CMA, am acting as scribe for Sarina Ser, MD .  Documentation: I have reviewed the above documentation for accuracy and completeness, and I agree with the above.  Sarina Ser, MD

## 2020-08-02 NOTE — Patient Instructions (Signed)

## 2020-08-09 ENCOUNTER — Telehealth: Payer: Self-pay

## 2020-08-09 NOTE — Telephone Encounter (Signed)
No answer at number listed.

## 2020-08-09 NOTE — Telephone Encounter (Signed)
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

## 2020-08-14 ENCOUNTER — Telehealth: Payer: Self-pay

## 2020-08-14 NOTE — Telephone Encounter (Signed)
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

## 2020-08-14 NOTE — Telephone Encounter (Signed)
Unable to leave a message.

## 2020-08-17 ENCOUNTER — Telehealth: Payer: Self-pay

## 2020-08-17 NOTE — Telephone Encounter (Signed)
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

## 2020-08-17 NOTE — Telephone Encounter (Signed)
Unable to leave a message.

## 2021-01-26 ENCOUNTER — Other Ambulatory Visit (HOSPITAL_COMMUNITY): Payer: Self-pay | Admitting: Family Medicine

## 2021-01-26 ENCOUNTER — Other Ambulatory Visit: Payer: Self-pay | Admitting: Family Medicine

## 2021-01-26 DIAGNOSIS — R1310 Dysphagia, unspecified: Secondary | ICD-10-CM

## 2021-01-31 ENCOUNTER — Other Ambulatory Visit: Payer: Self-pay

## 2021-01-31 ENCOUNTER — Ambulatory Visit (INDEPENDENT_AMBULATORY_CARE_PROVIDER_SITE_OTHER): Payer: Medicare Other | Admitting: Dermatology

## 2021-01-31 ENCOUNTER — Encounter: Payer: Self-pay | Admitting: Dermatology

## 2021-01-31 DIAGNOSIS — L820 Inflamed seborrheic keratosis: Secondary | ICD-10-CM | POA: Diagnosis not present

## 2021-01-31 DIAGNOSIS — L570 Actinic keratosis: Secondary | ICD-10-CM

## 2021-01-31 DIAGNOSIS — L821 Other seborrheic keratosis: Secondary | ICD-10-CM

## 2021-01-31 DIAGNOSIS — Z86007 Personal history of in-situ neoplasm of skin: Secondary | ICD-10-CM

## 2021-01-31 DIAGNOSIS — L578 Other skin changes due to chronic exposure to nonionizing radiation: Secondary | ICD-10-CM | POA: Diagnosis not present

## 2021-01-31 DIAGNOSIS — Z1283 Encounter for screening for malignant neoplasm of skin: Secondary | ICD-10-CM | POA: Diagnosis not present

## 2021-01-31 DIAGNOSIS — L814 Other melanin hyperpigmentation: Secondary | ICD-10-CM

## 2021-01-31 DIAGNOSIS — D229 Melanocytic nevi, unspecified: Secondary | ICD-10-CM

## 2021-01-31 DIAGNOSIS — D1800 Hemangioma unspecified site: Secondary | ICD-10-CM

## 2021-01-31 NOTE — Progress Notes (Signed)
Follow-Up Visit   Subjective  Harold Nelson is a 86 y.o. male who presents for the following: Actinic Keratosis (Face, ears, scalp - check for new or persistent skin lesions).  He has several other areas to be checked today. The patient presents for Upper Body Skin Exam (UBSE) for skin cancer screening and mole check.  The following portions of the chart were reviewed this encounter and updated as appropriate:   Tobacco  Allergies  Meds  Problems  Med Hx  Surg Hx  Fam Hx     Review of Systems:  No other skin or systemic complaints except as noted in HPI or Assessment and Plan.  Objective  Well appearing patient in no apparent distress; mood and affect are within normal limits.  All skin waist up examined.  Objective  Face, scalp, ears (12): Erythematous thin papules/macules with gritty scale.   Objective  Face, scalp, nose x 10 (10): Erythematous keratotic or waxy stuck-on papule or plaque.   Assessment & Plan  AK (actinic keratosis) (12) Face, scalp, ears  Destruction of lesion - Face, scalp, ears Complexity: simple   Destruction method: cryotherapy   Informed consent: discussed and consent obtained   Timeout:  patient name, date of birth, surgical site, and procedure verified Lesion destroyed using liquid nitrogen: Yes   Region frozen until ice ball extended beyond lesion: Yes   Outcome: patient tolerated procedure well with no complications   Post-procedure details: wound care instructions given    Inflamed seborrheic keratosis (10) Face, scalp, nose x 10  Destruction of lesion - Face, scalp, nose x 10 Complexity: simple   Destruction method: cryotherapy   Informed consent: discussed and consent obtained   Timeout:  patient name, date of birth, surgical site, and procedure verified Lesion destroyed using liquid nitrogen: Yes   Region frozen until ice ball extended beyond lesion: Yes   Outcome: patient tolerated procedure well with no complications    Post-procedure details: wound care instructions given    Skin cancer screening   Lentigines - Scattered tan macules - Due to sun exposure - Benign-appering, observe - Recommend daily broad spectrum sunscreen SPF 30+ to sun-exposed areas, reapply every 2 hours as needed. - Call for any changes  Seborrheic Keratoses - Stuck-on, waxy, tan-brown papules and/or plaques  - Benign-appearing - Discussed benign etiology and prognosis. - Observe - Call for any changes  Melanocytic Nevi - Tan-brown and/or pink-flesh-colored symmetric macules and papules - Benign appearing on exam today - Observation - Call clinic for new or changing moles - Recommend daily use of broad spectrum spf 30+ sunscreen to sun-exposed areas.   Hemangiomas - Red papules - Discussed benign nature - Observe - Call for any changes  Actinic Damage - Chronic condition, secondary to cumulative UV/sun exposure - diffuse scaly erythematous macules with underlying dyspigmentation - Recommend daily broad spectrum sunscreen SPF 30+ to sun-exposed areas, reapply every 2 hours as needed.  - Staying in the shade or wearing long sleeves, sun glasses (UVA+UVB protection) and wide brim hats (4-inch brim around the entire circumference of the hat) are also recommended for sun protection.  - Call for new or changing lesions.  History of Squamous Cell Carcinoma in Situ of the Skin - No evidence of recurrence today - Recommend regular full body skin exams - Recommend daily broad spectrum sunscreen SPF 30+ to sun-exposed areas, reapply every 2 hours as needed.  - Call if any new or changing lesions are noted between office visits  Skin  cancer screening performed today.  Return in about 6 months (around 08/02/2021) for AK follow up .  Luther Redo, CMA, am acting as scribe for Sarina Ser, MD .  Documentation: I have reviewed the above documentation for accuracy and completeness, and I agree with the above.  Sarina Ser, MD

## 2021-01-31 NOTE — Patient Instructions (Signed)

## 2021-02-02 ENCOUNTER — Other Ambulatory Visit: Payer: Self-pay | Admitting: Family Medicine

## 2021-02-02 ENCOUNTER — Other Ambulatory Visit: Payer: Self-pay

## 2021-02-02 ENCOUNTER — Ambulatory Visit
Admission: RE | Admit: 2021-02-02 | Discharge: 2021-02-02 | Disposition: A | Discharge status: Home / Self Care | Payer: Medicare Other | Source: Ambulatory Visit | Attending: Family Medicine | Admitting: Family Medicine

## 2021-02-02 DIAGNOSIS — R1310 Dysphagia, unspecified: Secondary | ICD-10-CM | POA: Diagnosis present

## 2021-02-04 ENCOUNTER — Encounter: Payer: Self-pay | Admitting: Dermatology

## 2021-02-12 ENCOUNTER — Other Ambulatory Visit: Payer: Self-pay | Admitting: Family Medicine

## 2021-02-12 DIAGNOSIS — K449 Diaphragmatic hernia without obstruction or gangrene: Secondary | ICD-10-CM

## 2021-03-09 ENCOUNTER — Other Ambulatory Visit: Payer: Medicare Other

## 2021-03-22 ENCOUNTER — Ambulatory Visit: Payer: Medicare Other

## 2021-03-29 ENCOUNTER — Other Ambulatory Visit: Payer: Self-pay

## 2021-03-29 ENCOUNTER — Ambulatory Visit
Admission: RE | Admit: 2021-03-29 | Discharge: 2021-03-29 | Disposition: A | Discharge status: Home / Self Care | Payer: Medicare Other | Source: Ambulatory Visit | Attending: Family Medicine | Admitting: Family Medicine

## 2021-03-29 ENCOUNTER — Other Ambulatory Visit: Payer: Self-pay | Admitting: Family Medicine

## 2021-03-29 DIAGNOSIS — K449 Diaphragmatic hernia without obstruction or gangrene: Secondary | ICD-10-CM | POA: Diagnosis present

## 2021-03-29 DIAGNOSIS — E079 Disorder of thyroid, unspecified: Secondary | ICD-10-CM | POA: Insufficient documentation

## 2021-07-11 IMAGING — US US THYROID
1 series · 14 of 25 positions shown · non-contrast
Comparison: None.

CLINICAL DATA: Esophagram demonstrated mass-effect on hypopharynx
suggesting thyromegaly

EXAM:
THYROID ULTRASOUND
TECHNIQUE: Ultrasound examination of the thyroid gland and adjacent soft
tissues was performed.

[Series 1: us thyroid · 14 of 57 slices shown]
[im 1/57]
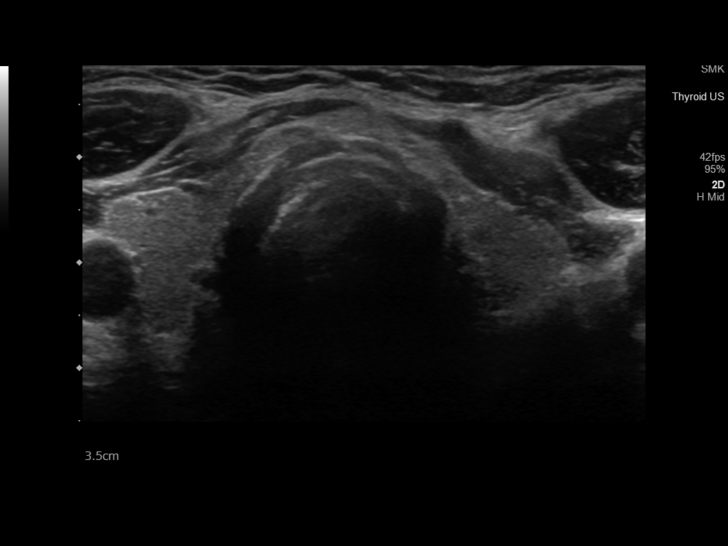
[im 5/57]
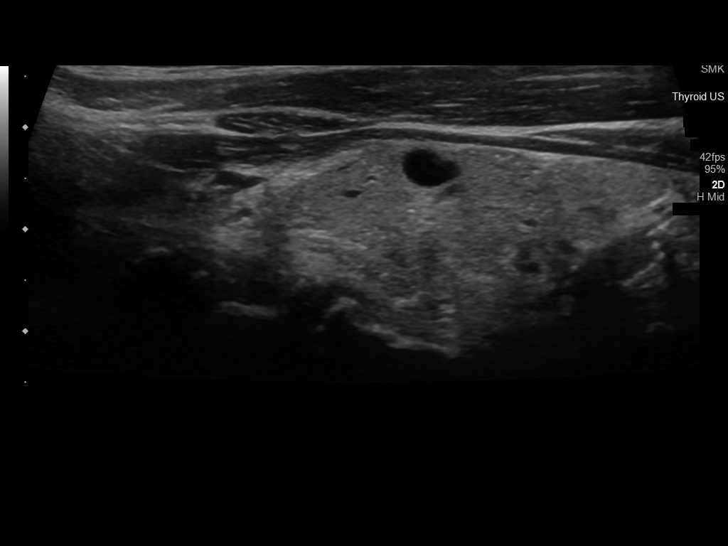
[im 10/57]
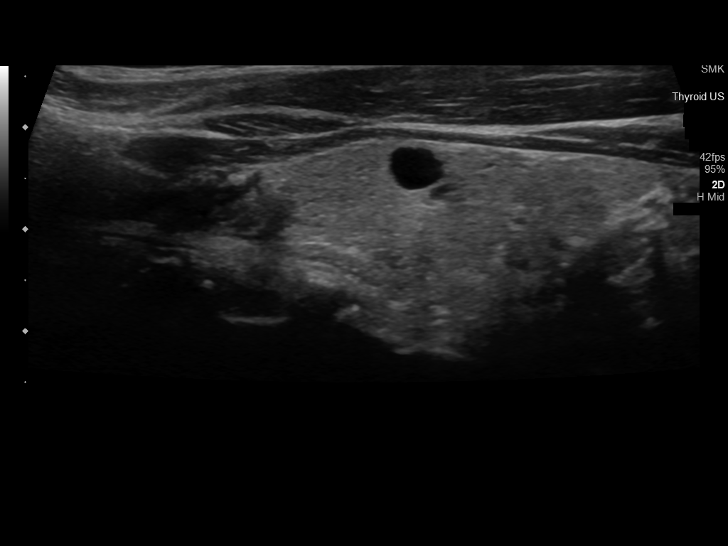
[im 15/57]
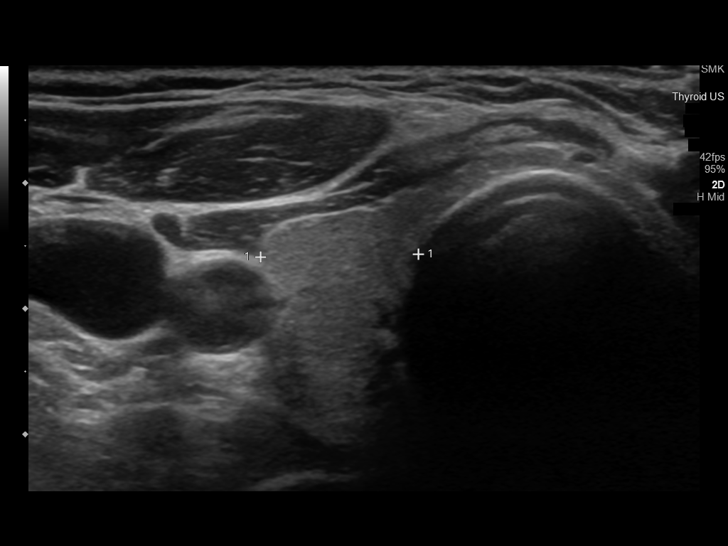
[im 19/57]
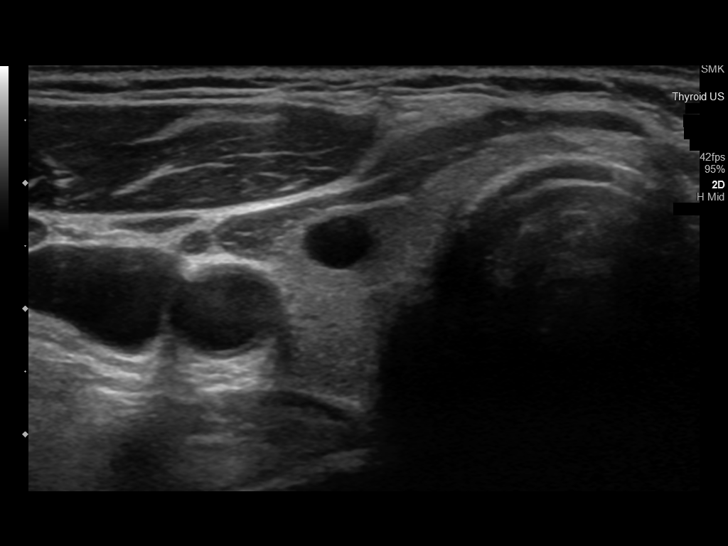
[im 22/57]
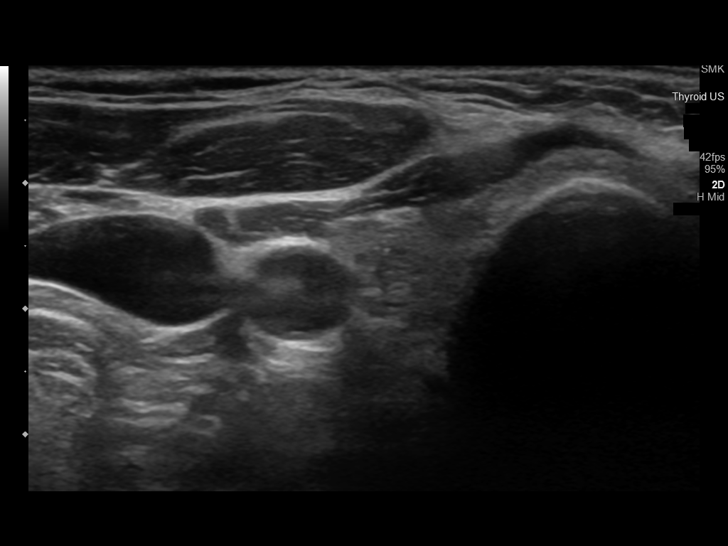
[im 26/57]
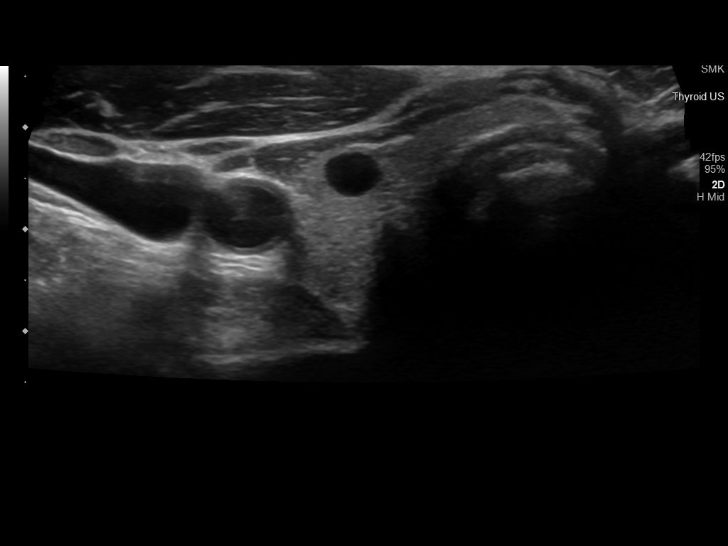
[im 31/57]
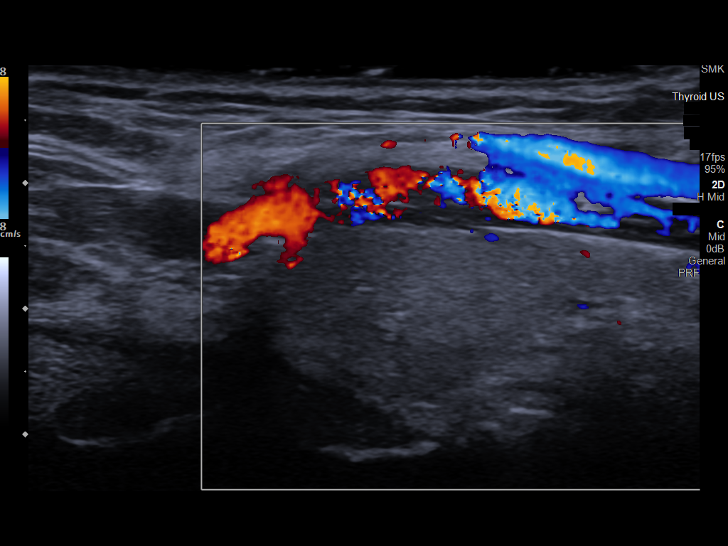
[im 36/57]
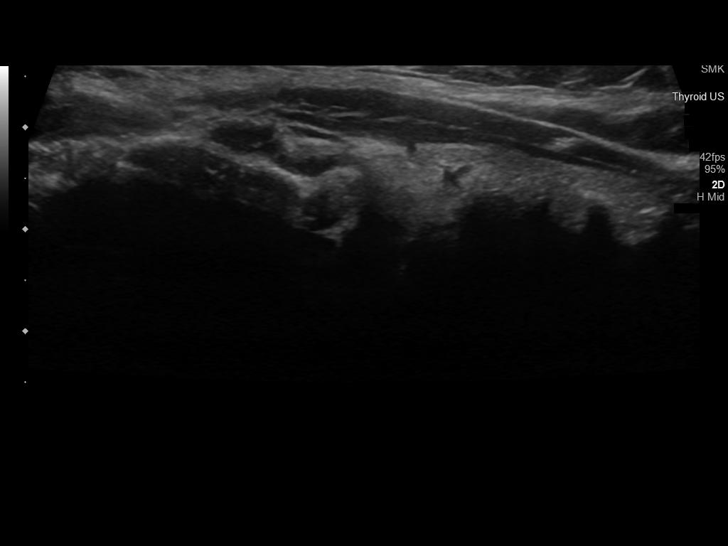
[im 38/57]
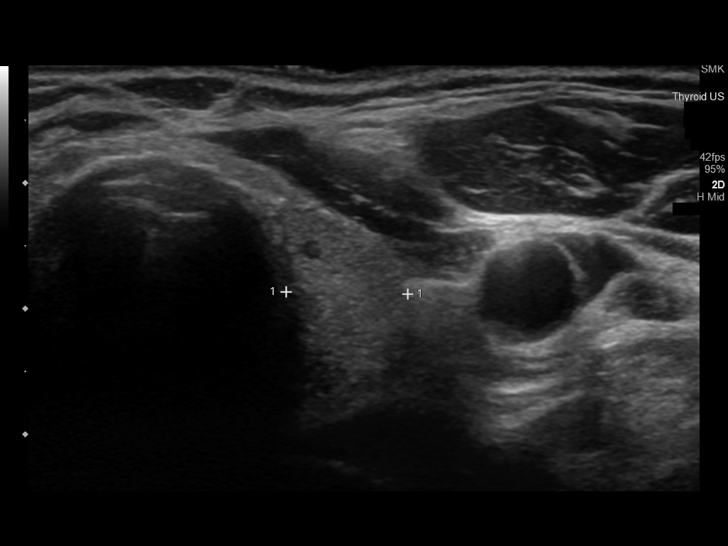
[im 43/57]
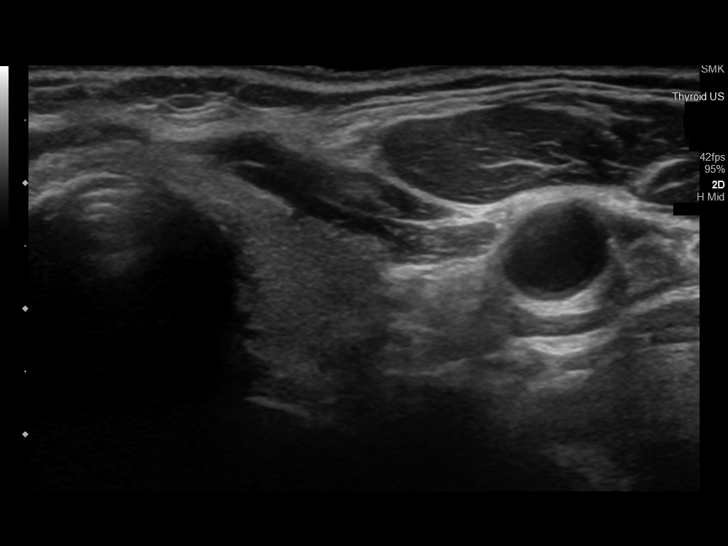
[im 47/57]
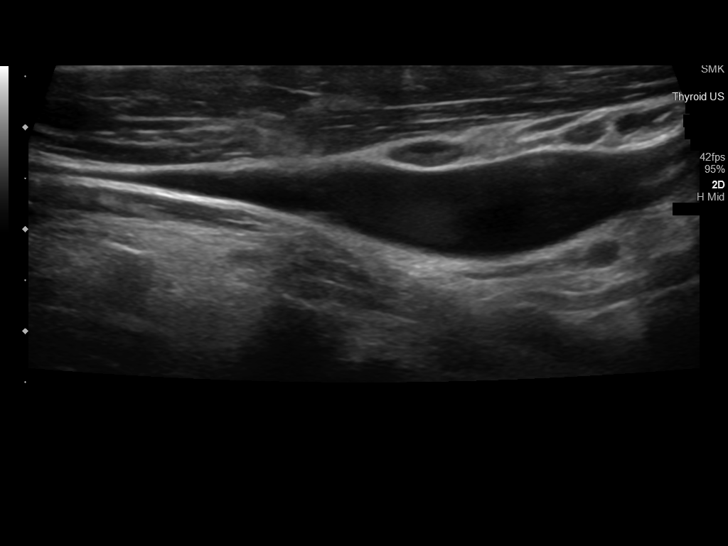
[im 52/57]
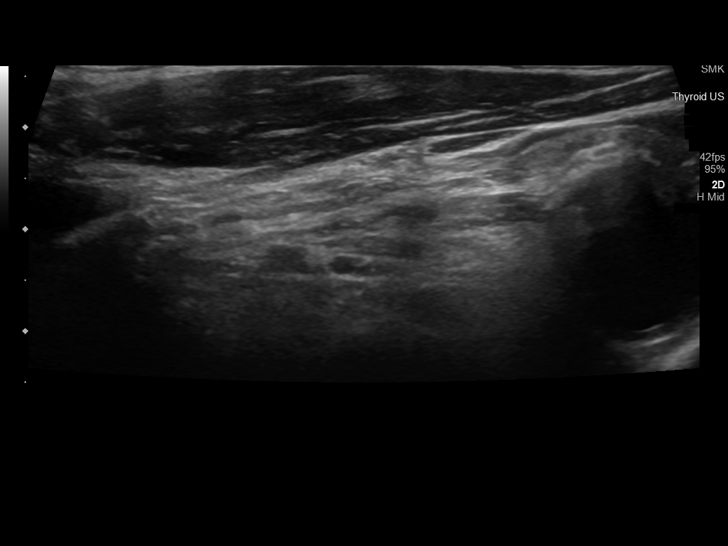
[im 57/57]
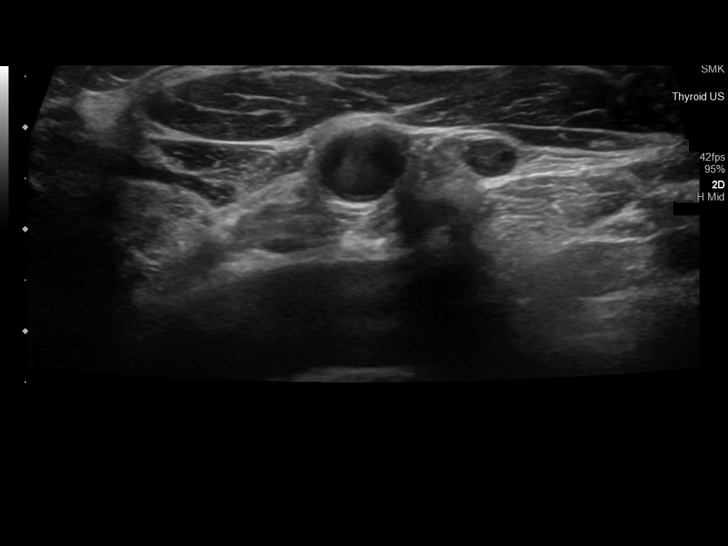

[14 of 25 positions shown; findings below may reference images not displayed]

FINDINGS: Parenchymal Echotexture: Mildly heterogenous

Isthmus: 0.3 cm thickness

Right lobe: 4.1 x 1.4 x 1.3 cm

Left lobe: 3.5 x 1.3 x 1 cm

_________________________________________________________

Estimated total number of nodules >/= 1 cm: 0

Number of spongiform nodules >/=  2 cm not described below (TR1): 0

Number of mixed cystic and solid nodules >/= 1.5 cm not described
below (TR2): 0

_________________________________________________________

No discrete nodules are seen within the thyroid gland. 0.6 cm benign
colloid cyst, superior right lobe. No regional cervical adenopathy.
IMPRESSION: Normal thyroid.

The above is in keeping with the ACR TI-RADS recommendations - [HOSPITAL] 9332;[DATE].

## 2021-07-19 ENCOUNTER — Encounter: Payer: Self-pay | Admitting: Dermatology

## 2021-07-19 ENCOUNTER — Ambulatory Visit (INDEPENDENT_AMBULATORY_CARE_PROVIDER_SITE_OTHER): Payer: Medicare Other | Admitting: Dermatology

## 2021-07-19 ENCOUNTER — Other Ambulatory Visit: Payer: Self-pay

## 2021-07-19 DIAGNOSIS — L820 Inflamed seborrheic keratosis: Secondary | ICD-10-CM

## 2021-07-19 DIAGNOSIS — Z85828 Personal history of other malignant neoplasm of skin: Secondary | ICD-10-CM

## 2021-07-19 DIAGNOSIS — L570 Actinic keratosis: Secondary | ICD-10-CM

## 2021-07-19 DIAGNOSIS — L821 Other seborrheic keratosis: Secondary | ICD-10-CM

## 2021-07-19 DIAGNOSIS — D229 Melanocytic nevi, unspecified: Secondary | ICD-10-CM | POA: Diagnosis not present

## 2021-07-19 DIAGNOSIS — L578 Other skin changes due to chronic exposure to nonionizing radiation: Secondary | ICD-10-CM

## 2021-07-19 DIAGNOSIS — D1800 Hemangioma unspecified site: Secondary | ICD-10-CM

## 2021-07-19 DIAGNOSIS — Z1283 Encounter for screening for malignant neoplasm of skin: Secondary | ICD-10-CM

## 2021-07-19 DIAGNOSIS — L814 Other melanin hyperpigmentation: Secondary | ICD-10-CM

## 2021-07-19 NOTE — Progress Notes (Signed)
Follow-Up Visit   Subjective  Harold Nelson is a 86 y.o. male who presents for the following: Annual Exam. Pt c/o several irritated spots on his face, scalp and arms. The patient presents for Upper Body Skin Exam (UBSE) for skin cancer screening and mole check.   The following portions of the chart were reviewed this encounter and updated as appropriate:   Tobacco  Allergies  Meds  Problems  Med Hx  Surg Hx  Fam Hx     Review of Systems:  No other skin or systemic complaints except as noted in HPI or Assessment and Plan.  Objective  Well appearing patient in no apparent distress; mood and affect are within normal limits.  All skin waist up examined.  right forearm x 1, posterior scalp x 1  (2) (2) Erythematous keratotic or waxy stuck-on papule or plaque.   scalp,face,ears x 15 (15) Erythematous thin papules/macules with gritty scale.    Assessment & Plan  Inflamed seborrheic keratosis right forearm x 1, posterior scalp x 1  (2)  Destruction of lesion - right forearm x 1, posterior scalp x 1  (2) Complexity: simple   Destruction method: cryotherapy   Informed consent: discussed and consent obtained   Timeout:  patient name, date of birth, surgical site, and procedure verified Lesion destroyed using liquid nitrogen: Yes   Region frozen until ice ball extended beyond lesion: Yes   Outcome: patient tolerated procedure well with no complications   Post-procedure details: wound care instructions given    AK (actinic keratosis) (15) scalp,face,ears x 15  Destruction of lesion - scalp,face,ears x 15 Complexity: simple   Destruction method: cryotherapy   Informed consent: discussed and consent obtained   Timeout:  patient name, date of birth, surgical site, and procedure verified Lesion destroyed using liquid nitrogen: Yes   Region frozen until ice ball extended beyond lesion: Yes   Outcome: patient tolerated procedure well with no complications   Post-procedure  details: wound care instructions given    Lentigines - Scattered tan macules - Due to sun exposure - Benign-appearing, observe - Recommend daily broad spectrum sunscreen SPF 30+ to sun-exposed areas, reapply every 2 hours as needed. - Call for any changes  Seborrheic Keratoses - Stuck-on, waxy, tan-brown papules and/or plaques  - Benign-appearing - Discussed benign etiology and prognosis. - Observe - Call for any changes  Melanocytic Nevi - Tan-brown and/or pink-flesh-colored symmetric macules and papules - Benign appearing on exam today - Observation - Call clinic for new or changing moles - Recommend daily use of broad spectrum spf 30+ sunscreen to sun-exposed areas.   Hemangiomas - Red papules - Discussed benign nature - Observe - Call for any changes  Actinic Damage - Severe, confluent actinic changes with pre-cancerous actinic keratoses  - Severe, chronic, not at goal, secondary to cumulative UV radiation exposure over time - diffuse scaly erythematous macules and papules with underlying dyspigmentation - Discussed Prescription "Field Treatment" for Severe, Chronic Confluent Actinic Changes with Pre-Cancerous Actinic Keratoses  PDT on scalp in 1 month  Field treatment involves treatment of an entire area of skin that has confluent Actinic Changes (Sun/ Ultraviolet light damage) and PreCancerous Actinic Keratoses by method of PhotoDynamic Therapy (PDT) and/or prescription Topical Chemotherapy agents such as 5-fluorouracil, 5-fluorouracil/calcipotriene, and/or imiquimod.  The purpose is to decrease the number of clinically evident and subclinical PreCancerous lesions to prevent progression to development of skin cancer by chemically destroying early precancer changes that may or may not be visible.  It has been shown to reduce the risk of developing skin cancer in the treated area. As a result of treatment, redness, scaling, crusting, and open sores may occur during treatment  course. One or more than one of these methods may be used and may have to be used several times to control, suppress and eliminate the PreCancerous changes. Discussed treatment course, expected reaction, and possible side effects. - Recommend daily broad spectrum sunscreen SPF 30+ to sun-exposed areas, reapply every 2 hours as needed.  - Staying in the shade or wearing long sleeves, sun glasses (UVA+UVB protection) and wide brim hats (4-inch brim around the entire circumference of the hat) are also recommended. - Call for new or changing lesions.   History of Squamous Cell Carcinoma of the Skin Multiple see history  - No evidence of recurrence today - No lymphadenopathy - Recommend regular full body skin exams - Recommend daily broad spectrum sunscreen SPF 30+ to sun-exposed areas, reapply every 2 hours as needed.  - Call if any new or changing lesions are noted between office visits  Skin cancer screening performed today.   Return in about 6 months (around 01/17/2022) for Aks, PDT in 1 month on scalp .  IMarye Round, CMA, am acting as scribe for Sarina Ser, MD .  Documentation: I have reviewed the above documentation for accuracy and completeness, and I agree with the above.  Sarina Ser, MD

## 2021-07-19 NOTE — Patient Instructions (Addendum)
Levulan/PDT Treatment Common Side Effects  - Burning/stinging, which may be severe and last up to 24-72 hours after your treatment  - Redness, swelling and/or peeling which may last up to 4 weeks  - Scaling/crusting which may last up to 2 weeks  - Sun sensitivity (you MUST avoid sun exposure for 48-72 hours after treatment)  Care Instructions  - Okay to wash with soap and water and shampoo as normal  - If needed, you can do a cold compress (ex. Ice packs) for comfort  - If okay with your Primary Doctor, you may use analgesics such as Tylenol every 4-6 hours, not to exceed recommended dose  - You may apply Cerave Healing Ointment, Vaseline or Aquaphor  - If you have a lot of swelling you may take a Benadryl to help with this (this may cause drowsiness)  Sun Precautions  - Wear a wide brim hat for the next week if outside  - Wear a sunblock with zinc or titanium dioxide at least SPF 50 daily   We will recheck you in 10-12 weeks. If any problems, please call the office and ask to speak with a nurse.     Cryotherapy Aftercare  Wash gently with soap and water everyday.   Apply Vaseline and Band-Aid daily until healed.     If you have any questions or concerns for your doctor, please call our main line at [phone] and press option 4 to reach your doctor's medical assistant. If no one answers, please leave a voicemail as directed and we will return your call as soon as possible. Messages left after 4 pm will be answered the following business day.   You may also send [place] a message via [place]. We typically respond to MyChart messages within 1-2 business days.  For prescription refills, please ask your pharmacy to contact our office. Our fax number is [phone].  If you have an urgent issue when the clinic is closed that cannot wait until the next business day, you can page your doctor at the number below.    Please note that while we do our best to be available for  urgent issues outside of office hours, we are not available 24/7.   If you have an urgent issue and are unable to reach [place], you may choose to seek medical care at your doctor's office, retail clinic, urgent care center, or emergency room.  If you have a medical emergency, please immediately call 911 or go to the emergency department.  Pager Numbers  - Dr. Nehemiah Massed: [phone]  - Dr. Laurence Ferrari: [phone]  - Dr. Nicole Kindred: [phone]  In the event of inclement weather, please call our main line at [phone] for an update on the status of any delays or closures.  Dermatology Medication Tips: Please keep the boxes that topical medications come in in order to help keep track of the instructions about where and how to use these. Pharmacies typically print the medication instructions only on the boxes and not directly on the medication tubes.   If your medication is too expensive, please contact our office at [phone] option 4 or send [place] a message through [place].   We are unable to tell what your co-pay for medications will be in advance as this is different depending on your insurance coverage. However, we may be able to find a substitute medication at lower cost or fill out paperwork to get insurance to cover a needed medication.   If a prior authorization is required to  get your medication covered by your insurance company, please allow [place] 1-2 business days to complete this process.  Drug prices often vary depending on where the prescription is filled and some pharmacies may offer cheaper prices.  The website www.goodrx.com contains coupons for medications through different pharmacies. The prices here do not account for what the cost may be with help from insurance (it may be cheaper with your insurance), but the website can give you the price if you did not use any insurance.  - You can print the associated coupon and take it with your prescription to the pharmacy.  - You may also  stop by our office during regular business hours and pick up a GoodRx coupon card.  - If you need your prescription sent electronically to a different pharmacy, notify our office through [facility] or by phone at [phone] option 4.

## 2021-08-22 ENCOUNTER — Ambulatory Visit: Payer: Medicare Other

## 2021-08-27 ENCOUNTER — Ambulatory Visit (INDEPENDENT_AMBULATORY_CARE_PROVIDER_SITE_OTHER): Payer: Medicare Other

## 2021-08-27 ENCOUNTER — Ambulatory Visit: Payer: Medicare Other

## 2021-08-27 ENCOUNTER — Other Ambulatory Visit: Payer: Self-pay

## 2021-08-27 DIAGNOSIS — L570 Actinic keratosis: Secondary | ICD-10-CM | POA: Diagnosis not present

## 2021-08-27 MED ORDER — AMINOLEVULINIC ACID HCL 20 % EX SOLR
1.0000 "application " | Freq: Once | CUTANEOUS | Status: AC
  Administered 2021-08-27: 354 mg via TOPICAL

## 2021-08-27 NOTE — Progress Notes (Signed)
Patient completed PDT therapy today.  1. AK (actinic keratosis) Scalp  Photodynamic therapy - Scalp Procedure discussed: discussed risks, benefits, side effects. and alternatives   Prep: site scrubbed/prepped with acetone   Location:  Scalp Number of lesions:  Multiple Type of treatment:  Blue light Aminolevulinic Acid (see MAR for details): Levulan Number of Levulan sticks used:  1 Incubation time (minutes):  120 Number of minutes under lamp:  16 Number of seconds under lamp:  40 Cooling:  Floor fan Outcome: patient tolerated procedure well with no complications   Post-procedure details: sunscreen applied    Aminolevulinic Acid HCl 20 % SOLR 354 mg - Scalp    

## 2021-08-27 NOTE — Patient Instructions (Signed)

## 2022-01-17 ENCOUNTER — Ambulatory Visit (INDEPENDENT_AMBULATORY_CARE_PROVIDER_SITE_OTHER): Payer: Medicare Other | Admitting: Dermatology

## 2022-01-17 DIAGNOSIS — L570 Actinic keratosis: Secondary | ICD-10-CM | POA: Diagnosis not present

## 2022-01-17 DIAGNOSIS — L578 Other skin changes due to chronic exposure to nonionizing radiation: Secondary | ICD-10-CM

## 2022-01-17 DIAGNOSIS — L820 Inflamed seborrheic keratosis: Secondary | ICD-10-CM

## 2022-01-17 NOTE — Patient Instructions (Addendum)

## 2022-01-17 NOTE — Progress Notes (Signed)
? ?Follow-Up Visit ?  ?Subjective  ?Harold Kliethermes is a 87 y.o. male who presents for the following: Actinic Keratosis (6 months f/u Aks on face and scalp, treated scalp with PDT ~5 months ago with a fair response). ?The patient has spots, moles and lesions to be evaluated, some may be new or changing.  ? ?The following portions of the chart were reviewed this encounter and updated as appropriate:  ? Tobacco  Allergies  Meds  Problems  Med Hx  Surg Hx  Fam Hx   ?  ?Review of Systems:  No other skin or systemic complaints except as noted in HPI or Assessment and Plan. ? ?Objective  ?Well appearing patient in no apparent distress; mood and affect are within normal limits. ? ?A focused examination was performed including face,scalp. Relevant physical exam findings are noted in the Assessment and Plan. ? ?face,scalp, ears x 19 (19) ?Erythematous thin papules/macules with gritty scale.  ? ?left preauricular x 2 (2) ?Stuck-on, waxy, tan-brown papules ? ? ?Assessment & Plan  ?AK (actinic keratosis) (19) ?face,scalp, ears x 19 ? ?Actinic keratoses are precancerous spots that appear secondary to cumulative UV radiation exposure/sun exposure over time. They are chronic with expected duration over 1 year. A portion of actinic keratoses will progress to squamous cell carcinoma of the skin. It is not possible to reliably predict which spots will progress to skin cancer and so treatment is recommended to prevent development of skin cancer. ? ?Recommend daily broad spectrum sunscreen SPF 30+ to sun-exposed areas, reapply every 2 hours as needed.  ?Recommend staying in the shade or wearing long sleeves, sun glasses (UVA+UVB protection) and wide brim hats (4-inch brim around the entire circumference of the hat). ?Call for new or changing lesions.  ? ?Destruction of lesion - face,scalp, ears x 19 ?Complexity: simple   ?Destruction method: cryotherapy   ?Informed consent: discussed and consent obtained   ?Timeout:  patient name,  date of birth, surgical site, and procedure verified ?Lesion destroyed using liquid nitrogen: Yes   ?Region frozen until ice ball extended beyond lesion: Yes   ?Outcome: patient tolerated procedure well with no complications   ?Post-procedure details: wound care instructions given   ? ?Inflamed seborrheic keratosis (2) ?left preauricular x 2 ? ?Reassured benign age-related growth.  Recommend observation.  Discussed cryotherapy if spot(s) become irritated or inflamed.  ? ?Destruction of lesion - left preauricular x 2 ?Complexity: simple   ?Destruction method: cryotherapy   ?Informed consent: discussed and consent obtained   ?Timeout:  patient name, date of birth, surgical site, and procedure verified ?Lesion destroyed using liquid nitrogen: Yes   ?Region frozen until ice ball extended beyond lesion: Yes   ?Outcome: patient tolerated procedure well with no complications   ?Post-procedure details: wound care instructions given   ? ?Actinic Damage ?- chronic, secondary to cumulative UV radiation exposure/sun exposure over time ?- diffuse scaly erythematous macules with underlying dyspigmentation ?- Recommend daily broad spectrum sunscreen SPF 30+ to sun-exposed areas, reapply every 2 hours as needed.  ?- Recommend staying in the shade or wearing long sleeves, sun glasses (UVA+UVB protection) and wide brim hats (4-inch brim around the entire circumference of the hat). ?- Call for new or changing lesions.  ? ?Return in about 6 months (around 07/19/2022) for TBSE, hx of AKs . ? ?I, Marye Round, CMA, am acting as scribe for Sarina Ser, MD .  ?Documentation: I have reviewed the above documentation for accuracy and completeness, and I agree with the  above. ? ?Sarina Ser, MD ? ?

## 2022-01-25 ENCOUNTER — Encounter: Payer: Self-pay | Admitting: Dermatology

## 2022-08-01 ENCOUNTER — Ambulatory Visit (INDEPENDENT_AMBULATORY_CARE_PROVIDER_SITE_OTHER): Payer: Medicare Other | Admitting: Dermatology

## 2022-08-01 DIAGNOSIS — Z5111 Encounter for antineoplastic chemotherapy: Secondary | ICD-10-CM

## 2022-08-01 DIAGNOSIS — L814 Other melanin hyperpigmentation: Secondary | ICD-10-CM

## 2022-08-01 DIAGNOSIS — Z79899 Other long term (current) drug therapy: Secondary | ICD-10-CM

## 2022-08-01 DIAGNOSIS — L820 Inflamed seborrheic keratosis: Secondary | ICD-10-CM

## 2022-08-01 DIAGNOSIS — B353 Tinea pedis: Secondary | ICD-10-CM

## 2022-08-01 DIAGNOSIS — Z1283 Encounter for screening for malignant neoplasm of skin: Secondary | ICD-10-CM | POA: Diagnosis not present

## 2022-08-01 DIAGNOSIS — Z85828 Personal history of other malignant neoplasm of skin: Secondary | ICD-10-CM

## 2022-08-01 DIAGNOSIS — L821 Other seborrheic keratosis: Secondary | ICD-10-CM

## 2022-08-01 DIAGNOSIS — L578 Other skin changes due to chronic exposure to nonionizing radiation: Secondary | ICD-10-CM | POA: Diagnosis not present

## 2022-08-01 DIAGNOSIS — L570 Actinic keratosis: Secondary | ICD-10-CM

## 2022-08-01 DIAGNOSIS — D229 Melanocytic nevi, unspecified: Secondary | ICD-10-CM

## 2022-08-01 DIAGNOSIS — Z8589 Personal history of malignant neoplasm of other organs and systems: Secondary | ICD-10-CM

## 2022-08-01 MED ORDER — KETOCONAZOLE 2 % EX CREA: 1.0000 | TOPICAL_CREAM | Freq: Every day | CUTANEOUS | 6 refills | Status: DC

## 2022-08-01 NOTE — Patient Instructions (Signed)
Cryotherapy Aftercare  Wash gently with soap and water everyday.   Apply Vaseline and Band-Aid daily until healed.     Due to recent changes in healthcare laws, you may see results of your pathology and/or laboratory studies on MyChart before the doctors have had a chance to review them. We understand that in some cases there may be results that are confusing or concerning to you. Please understand that not all results are received at the same time and often the doctors may need to interpret multiple results in order to provide you with the best plan of care or course of treatment. Therefore, we ask that you please give us 2 business days to thoroughly review all your results before contacting the office for clarification. Should we see a critical lab result, you will be contacted sooner.   If You Need Anything After Your Visit  If you have any questions or concerns for your doctor, please call our main line at [phone] and press option 4 to reach your doctor's medical assistant. If no one answers, please leave a voicemail as directed and we will return your call as soon as possible. Messages left after 4 pm will be answered the following business day.   You may also send us a message via MyChart. We typically respond to MyChart messages within 1-2 business days.  For prescription refills, please ask your pharmacy to contact our office. Our fax number is [phone].  If you have an urgent issue when the clinic is closed that cannot wait until the next business day, you can page your doctor at the number below.    Please note that while we do our best to be available for urgent issues outside of office hours, we are not available 24/7.   If you have an urgent issue and are unable to reach us, you may choose to seek medical care at your doctor's office, retail clinic, urgent care center, or emergency room.  If you have a medical emergency, please immediately call 911 or go to the  emergency department.  Pager Numbers  - Dr. Kowalski: [phone]  - Dr. Moye: [phone]  - Dr. Stewart: [phone]  In the event of inclement weather, please call our main line at [phone] for an update on the status of any delays or closures.  Dermatology Medication Tips: Please keep the boxes that topical medications come in in order to help keep track of the instructions about where and how to use these. Pharmacies typically print the medication instructions only on the boxes and not directly on the medication tubes.   If your medication is too expensive, please contact our office at [phone] option 4 or send us a message through MyChart.   We are unable to tell what your co-pay for medications will be in advance as this is different depending on your insurance coverage. However, we may be able to find a substitute medication at lower cost or fill out paperwork to get insurance to cover a needed medication.   If a prior authorization is required to get your medication covered by your insurance company, please allow us 1-2 business days to complete this process.  Drug prices often vary depending on where the prescription is filled and some pharmacies may offer cheaper prices.  The website www.goodrx.com contains coupons for medications through different pharmacies. The prices here do not account for what the cost may be with help from insurance (it may be cheaper with your insurance), but the website can   give you the price if you did not use any insurance.  - You can print the associated coupon and take it with your prescription to the pharmacy.  - You may also stop by our office during regular business hours and pick up a GoodRx coupon card.  - If you need your prescription sent electronically to a different pharmacy, notify our office through  MyChart or by phone at [phone] option 4.     Si Usted Necesita Algo Despus de Su Visita  Tambin puede  enviarnos un mensaje a travs de MyChart. Por lo general respondemos a los mensajes de MyChart en el transcurso de 1 a 2 das hbiles.  Para renovar recetas, por favor pida a su farmacia que se ponga en contacto con nuestra oficina. Nuestro nmero de fax es el [phone].  Si tiene un asunto urgente cuando la clnica est cerrada y que no puede esperar hasta el siguiente da hbil, puede llamar/localizar a su doctor(a) al nmero que aparece a continuacin.   Por favor, tenga en cuenta que aunque hacemos todo lo posible para estar disponibles para asuntos urgentes fuera del horario de oficina, no estamos disponibles las 24 horas del da, los 7 das de la semana.   Si tiene un problema urgente y no puede comunicarse con nosotros, puede optar por buscar atencin mdica  en el consultorio de su doctor(a), en una clnica privada, en un centro de atencin urgente o en una sala de emergencias.  Si tiene una emergencia mdica, por favor llame inmediatamente al 911 o vaya a la sala de emergencias.  Nmeros de bper  - Dr. Kowalski: [phone]  - Dra. Moye: [phone]  - Dra. Stewart: [phone]  En caso de inclemencias del tiempo, por favor llame a nuestra lnea principal al [phone] para una actualizacin sobre el estado de cualquier retraso o cierre.  Consejos para la medicacin en dermatologa: Por favor, guarde las cajas en las que vienen los medicamentos de uso tpico para ayudarle a seguir las instrucciones sobre dnde y cmo usarlos. Las farmacias generalmente imprimen las instrucciones del medicamento slo en las cajas y no directamente en los tubos del medicamento.   Si su medicamento es muy caro, por favor, pngase en contacto con nuestra oficina llamando al [phone] y presione la opcin 4 o envenos un mensaje a travs de MyChart.   No podemos decirle cul ser su copago por los medicamentos por adelantado ya que esto es diferente dependiendo de la cobertura de su seguro.  Sin embargo, es posible que podamos encontrar un medicamento sustituto a menor costo o llenar un formulario para que el seguro cubra el medicamento que se considera necesario.   Si se requiere una autorizacin previa para que su compaa de seguros cubra su medicamento, por favor permtanos de 1 a 2 das hbiles para completar este proceso.  Los precios de los medicamentos varan con frecuencia dependiendo del lugar de dnde se surte la receta y alguna farmacias pueden ofrecer precios ms baratos.  El sitio web www.goodrx.com tiene cupones para medicamentos de diferentes farmacias. Los precios aqu no tienen en cuenta lo que podra costar con la ayuda del seguro (puede ser ms barato con su seguro), pero el sitio web puede darle el precio si no utiliz ningn seguro.  - Puede imprimir el cupn correspondiente y llevarlo con su receta a la farmacia.  - Tambin puede pasar por nuestra oficina durante el horario de atencin regular y recoger una tarjeta de cupones de GoodRx.  -   Si necesita que su receta se enve electrnicamente a una farmacia diferente, informe a nuestra oficina a travs de MyChart de [place] o por telfono llamando al [phone] y presione la opcin 4.  

## 2022-08-01 NOTE — Progress Notes (Signed)
Follow-Up Visit   Subjective  Harold Nelson is a 87 y.o. male who presents for the following: Annual Exam (History of SCC - The patient presents for Total-Body Skin Exam (TBSE) for skin cancer screening and mole check.  The patient has spots, moles and lesions to be evaluated, some may be new or changing and the patient has concerns that these could be cancer./).  The following portions of the chart were reviewed this encounter and updated as appropriate:   Tobacco  Allergies  Meds  Problems  Med Hx  Surg Hx  Fam Hx     Review of Systems:  No other skin or systemic complaints except as noted in HPI or Assessment and Plan.  Objective  Well appearing patient in no apparent distress; mood and affect are within normal limits.  A full examination was performed including scalp, head, eyes, ears, nose, lips, neck, chest, axillae, abdomen, back, buttocks, bilateral upper extremities, bilateral lower extremities, hands, feet, fingers, toes, fingernails, and toenails. All findings within normal limits unless otherwise noted below.  Feet Scale  Scalp, face, ears (16) Erythematous thin papules/macules with gritty scale.   Scalp Erythematous stuck-on, waxy papule or plaque   Assessment & Plan   History of Squamous Cell Carcinoma of the Skin - No evidence of recurrence today - No lymphadenopathy - Recommend regular full body skin exams - Recommend daily broad spectrum sunscreen SPF 30+ to sun-exposed areas, reapply every 2 hours as needed.  - Call if any new or changing lesions are noted between office visits  Lentigines - Scattered tan macules - Due to sun exposure - Benign-appearing, observe - Recommend daily broad spectrum sunscreen SPF 30+ to sun-exposed areas, reapply every 2 hours as needed. - Call for any changes  Seborrheic Keratoses - Stuck-on, waxy, tan-brown papules and/or plaques  - Benign-appearing - Discussed benign etiology and prognosis. - Observe - Call for  any changes  Melanocytic Nevi - Tan-brown and/or pink-flesh-colored symmetric macules and papules - Benign appearing on exam today - Observation - Call clinic for new or changing moles - Recommend daily use of broad spectrum spf 30+ sunscreen to sun-exposed areas.   Hemangiomas - Red papules - Discussed benign nature - Observe - Call for any changes  Actinic Damage - Chronic condition, secondary to cumulative UV/sun exposure - diffuse scaly erythematous macules with underlying dyspigmentation - Recommend daily broad spectrum sunscreen SPF 30+ to sun-exposed areas, reapply every 2 hours as needed.  - Staying in the shade or wearing long sleeves, sun glasses (UVA+UVB protection) and wide brim hats (4-inch brim around the entire circumference of the hat) are also recommended for sun protection.  - Call for new or changing lesions.  Skin cancer screening performed today.  AK (actinic keratosis) (16) Scalp, face, ears  Actinic Damage - Severe, confluent actinic changes with pre-cancerous actinic keratoses  - Severe, chronic, not at goal, secondary to cumulative UV radiation exposure over time - diffuse scaly erythematous macules and papules with underlying dyspigmentation - Discussed Prescription "Field Treatment" for Severe, Chronic Confluent Actinic Changes with Pre-Cancerous Actinic Keratoses Field treatment involves treatment of an entire area of skin that has confluent Actinic Changes (Sun/ Ultraviolet light damage) and PreCancerous Actinic Keratoses by method of PhotoDynamic Therapy (PDT) and/or prescription Topical Chemotherapy agents such as 5-fluorouracil, 5-fluorouracil/calcipotriene, and/or imiquimod.  The purpose is to decrease the number of clinically evident and subclinical PreCancerous lesions to prevent progression to development of skin cancer by chemically destroying early precancer changes that may  or may not be visible.  It has been shown to reduce the risk of developing  skin cancer in the treated area. As a result of treatment, redness, scaling, crusting, and open sores may occur during treatment course. One or more than one of these methods may be used and may have to be used several times to control, suppress and eliminate the PreCancerous changes. Discussed treatment course, expected reaction, and possible side effects. - Recommend daily broad spectrum sunscreen SPF 30+ to sun-exposed areas, reapply every 2 hours as needed.  - Staying in the shade or wearing long sleeves, sun glasses (UVA+UVB protection) and wide brim hats (4-inch brim around the entire circumference of the hat) are also recommended. - Call for new or changing lesions.  Discussed red light treatment vs blue light treatment vs topical treatment - recommend PDT with blue light to scalp on follow up  Destruction of lesion - Scalp, face, ears Complexity: simple   Destruction method: cryotherapy   Informed consent: discussed and consent obtained   Timeout:  patient name, date of birth, surgical site, and procedure verified Lesion destroyed using liquid nitrogen: Yes   Region frozen until ice ball extended beyond lesion: Yes   Outcome: patient tolerated procedure well with no complications   Post-procedure details: wound care instructions given    Tinea pedis of both feet Feet  Chronic and persistent condition with duration or expected duration over one year. Condition is symptomatic / bothersome to patient. Not to goal.  Start Ketoconazole 2% cream qhs to bottom of feet, sides of feet and between toes   ketoconazole (NIZORAL) 2 % cream - Feet Apply 1 Application topically at bedtime. to bottom of feet, sides of feet and between toes  Inflamed seborrheic keratosis Scalp  Destruction of lesion - Scalp Complexity: simple   Destruction method: cryotherapy   Informed consent: discussed and consent obtained   Timeout:  patient name, date of birth, surgical site, and procedure  verified Lesion destroyed using liquid nitrogen: Yes   Region frozen until ice ball extended beyond lesion: Yes   Outcome: patient tolerated procedure well with no complications   Post-procedure details: wound care instructions given     Return in about 6 weeks (around 09/12/2022) for PDT with blue light, then 1 year TBSE.  I, Ashok Cordia, CMA, am acting as scribe for Sarina Ser, MD . Documentation: I have reviewed the above documentation for accuracy and completeness, and I agree with the above.  Sarina Ser, MD

## 2022-08-06 ENCOUNTER — Encounter: Payer: Self-pay | Admitting: Dermatology

## 2022-09-12 ENCOUNTER — Other Ambulatory Visit: Payer: Self-pay

## 2022-09-12 ENCOUNTER — Ambulatory Visit (INDEPENDENT_AMBULATORY_CARE_PROVIDER_SITE_OTHER): Payer: Medicare Other | Admitting: Dermatology

## 2022-09-12 DIAGNOSIS — L570 Actinic keratosis: Secondary | ICD-10-CM | POA: Diagnosis not present

## 2022-09-12 MED ORDER — AMINOLEVULINIC ACID HCL 20 % EX SOLR
1.0000 | Freq: Once | CUTANEOUS | Status: AC
  Administered 2022-09-12: 354 mg via TOPICAL

## 2022-09-12 MED ORDER — MUPIROCIN 2 % EX OINT: 1.0000 | TOPICAL_OINTMENT | Freq: Two times a day (BID) | CUTANEOUS | 1 refills | Status: DC

## 2022-09-12 NOTE — Progress Notes (Signed)
Escripted in to [facility] per Dr. Nehemiah Massed.

## 2022-09-12 NOTE — Progress Notes (Signed)
Patient completed PDT therapy today.  1. AK (actinic keratosis) Scalp  Photodynamic therapy - Scalp Procedure discussed: discussed risks, benefits, side effects. and alternatives   Prep: site scrubbed/prepped with acetone   Debridement needed: Yes   Location:  Face Number of lesions:  Multiple Type of treatment:  Blue light Aminolevulinic Acid (see MAR for details): Levulan Number of Levulan sticks used:  1 Incubation time (minutes):  120 Number of minutes under lamp:  16 Number of seconds under lamp:  40 Cooling:  Floor fan Outcome: patient tolerated procedure well with no complications   Post-procedure details: sunscreen applied and aftercare instructions given to patient    Related Medications Aminolevulinic Acid HCl 20 % SOLR 354 mg   [id number]  Abby Foster RMA Documentation: I have reviewed the above documentation for accuracy and completeness, and I agree with the above.  Sarina Ser, MD

## 2022-09-12 NOTE — Patient Instructions (Signed)

## 2022-09-22 ENCOUNTER — Encounter: Payer: Self-pay | Admitting: Dermatology

## 2022-11-21 DIAGNOSIS — R7303 Prediabetes: Secondary | ICD-10-CM | POA: Insufficient documentation

## 2022-12-03 ENCOUNTER — Encounter: Payer: Self-pay | Admitting: Urology

## 2022-12-03 ENCOUNTER — Other Ambulatory Visit: Payer: Self-pay | Admitting: *Deleted

## 2022-12-03 ENCOUNTER — Other Ambulatory Visit
Admission: RE | Admit: 2022-12-03 | Discharge: 2022-12-03 | Disposition: A | Discharge status: Home / Self Care | Payer: Medicare Other | Attending: Urology | Admitting: Urology

## 2022-12-03 ENCOUNTER — Ambulatory Visit (INDEPENDENT_AMBULATORY_CARE_PROVIDER_SITE_OTHER): Payer: Medicare Other | Admitting: Urology

## 2022-12-03 VITALS — BP 148/96 | HR 72 | Ht 66.0 in | Wt 175.0 lb

## 2022-12-03 DIAGNOSIS — N401 Enlarged prostate with lower urinary tract symptoms: Secondary | ICD-10-CM | POA: Insufficient documentation

## 2022-12-03 DIAGNOSIS — R351 Nocturia: Secondary | ICD-10-CM | POA: Diagnosis not present

## 2022-12-03 DIAGNOSIS — Z87898 Personal history of other specified conditions: Secondary | ICD-10-CM

## 2022-12-03 DIAGNOSIS — R350 Frequency of micturition: Secondary | ICD-10-CM | POA: Diagnosis not present

## 2022-12-03 DIAGNOSIS — N393 Stress incontinence (female) (male): Secondary | ICD-10-CM

## 2022-12-03 DIAGNOSIS — N390 Urinary tract infection, site not specified: Secondary | ICD-10-CM

## 2022-12-03 DIAGNOSIS — R300 Dysuria: Secondary | ICD-10-CM | POA: Diagnosis not present

## 2022-12-03 DIAGNOSIS — R8281 Pyuria: Secondary | ICD-10-CM

## 2022-12-03 DIAGNOSIS — N138 Other obstructive and reflux uropathy: Secondary | ICD-10-CM

## 2022-12-03 LAB — URINALYSIS, COMPLETE (UACMP) WITH MICROSCOPIC
Bilirubin Urine: NEGATIVE
Glucose, UA: NEGATIVE mg/dL
Hgb urine dipstick: NEGATIVE
Ketones, ur: NEGATIVE mg/dL
Nitrite: POSITIVE — AB
Protein, ur: NEGATIVE mg/dL
RBC / HPF: NONE SEEN RBC/hpf (ref 0–5)
Specific Gravity, Urine: 1.015 (ref 1.005–1.030)
Squamous Epithelial / HPF: NONE SEEN /HPF (ref 0–5)
pH: 7 (ref 5.0–8.0)

## 2022-12-03 LAB — BLADDER SCAN AMB NON-IMAGING

## 2022-12-03 MED ORDER — TAMSULOSIN HCL 0.4 MG PO CAPS: 0.4000 mg | ORAL_CAPSULE | Freq: Every day | ORAL | 11 refills | Status: DC

## 2022-12-03 MED ORDER — SULFAMETHOXAZOLE-TRIMETHOPRIM 800-160 MG PO TABS: 1.0000 | ORAL_TABLET | Freq: Two times a day (BID) | ORAL | 0 refills | Status: AC

## 2022-12-03 NOTE — Patient Instructions (Signed)
Prostatitis  Prostatitis is swelling or inflammation of the prostate gland, also called the prostate. This gland is about 1.5 inches wide and 1 inch high, and it is involved in making semen. The prostate is located below a man's bladder, in front of the rectum. There are four types of prostatitis: Chronic prostatitis (CP), also called chronic pelvic pain syndrome (CPPS). This is the most common type of prostatitis. It is associated with increased muscle tone in the area between the hip bones (pelvic area), around the prostate. This type is also known as a pelvic floor disorder. Chronic bacterial prostatitis. This type usually results from an acute bacterial infection in the prostate gland that keeps coming back or has not been treated properly. The symptoms are less severe than those caused by acute bacterial prostatitis, which lasts a shorter time. Asymptomatic inflammatory prostatitis. This type does not have symptoms and does not need treatment. This is diagnosed when tests are done for other disorders of the urinary tract or reproductive tract. Acute bacterial prostatitis. This type starts quickly and results from an acute bacterial infection in the prostate gland. It is usually associated with a bladder infection, high fever, and chills. This is the least common type of prostatitis. What are the causes? Bacterial prostatitis is caused by an infection from bacteria. Chronic nonbacterial prostatitis may be caused by: Factors related to the nervous system. This system includes thebrain, spinal cord, and nerves. An autoimmune response. This happens when the body's disease-fighting system attacks healthy tissue in the body by mistake. Psychological factors. These have to do with how the mind works. The causes of the other types of prostatitis are usually not known. What are the signs or symptoms? Symptoms of this condition depend on the type of prostatitis you have. Acute bacterial  prostatitis Symptoms may include: Pain or burning during urination. Frequent and sudden urges to urinate. Trouble starting to urinate. Fever. Chills. Pain in your muscles or joints, lower back, or lower abdomen. Other types of prostatitis Symptoms may include: Sudden urges to urinate, or urinating often. Trouble starting to urinate. Weak urine stream. Dribbling after urination. Discharge coming from the penis. Pain in the testicles, the penis, or the tip of the penis. Pain in the area in front of the rectum and below the scrotum (perineum). Pain when ejaculating. How is this diagnosed? This condition may be diagnosed based on: A physical and medical exam. A digital rectal exam. For this, the health care provider may use a finger to feel the prostate. A urine test to check for bacteria. A semen sample or blood tests. Ultrasound. Urodynamic tests to check how your body handles urine. Cystoscopy to look inside your bladder or inside the part of your body that drains urine from the bladder (urethra). How is this treated? Treatment for this condition depends on the type of prostatitis. Treatment may involve: Medicines to relieve pain or inflammation, or to help relax your muscles. Physical therapy. Heat therapy. Biofeedback. These techniques help you control certain body functions. Relaxation exercises. Antibiotic medicine, if your condition is caused by bacteria. Sitz baths. These warm water baths help to relax your pelvic floor muscles, which helps to relieve pressure on the prostate. Follow these instructions at home: Medicines Take over-the-counter and prescription medicines only as told by your health care provider. If you were prescribed an antibiotic medicine, take it as told by your health care provider. Do not stop using the antibiotic even if you start to feel better. Managing pain and swelling  Take sitz baths as directed by your health care provider. For a sitz bath,  sit in warm water that is deep enough to cover your hips and buttocks. If directed, apply heat to the affected area as often as told by your health care provider. Use the heat source that your health care provider recommends, such as a moist heat pack or a heating pad. Place a towel between your skin and the heat source. Leave the heat on for 20-30 minutes. Remove the heat if your skin turns bright red. This is especially important if you are unable to feel pain, heat, or cold. You may have a greater risk of getting burned. General instructions Do exercises as told by your health care provider, if you were prescribed physical therapy, biofeedback, or relaxation exercises. Keep all follow-up visits as told by your health care provider. This is important. Where to find more information [employer] of Diabetes and Digestive and Kidney Diseases: http://www.bass.com/ Contact a health care provider if: Your symptoms get worse. You have a fever. Get help right away if: You have chills. You feel light-headed or feel like you may faint. You cannot urinate. You have blood or blood clots in your urine. Summary Prostatitis is swelling or inflammation of the prostate gland. Treatment for this condition depends on the type of prostatitis. Take over-the-counter and prescription medicines only as told by your health care provider. Get help right away of you have chills, feel light-headed, feel like you may faint, cannot urinate, or have blood or blood clots in your urine. This information is not intended to replace advice given to you by your health care provider. Make sure you discuss any questions you have with your health care provider. Document Revised: 08/08/2022 Document Reviewed: 08/08/2022 Elsevier Patient Education  [place].  Benign Prostatic Hyperplasia  Benign prostatic hyperplasia (BPH) is an enlarged prostate gland that is caused by the normal aging process. The  prostate may get bigger as a man gets older. The condition is not caused by cancer. The prostate is a walnut-sized gland that is involved in the production of semen. It is located in front of the rectum and below the bladder. The bladder stores urine. The urethra carries stored urine out of the body. An enlarged prostate can press on the urethra. This can make it harder to pass urine. The buildup of urine in the bladder can cause infection. Back pressure and infection may progress to bladder damage and kidney (renal) failure. What are the causes? This condition is part of the normal aging process. However, not all men develop problems from this condition. If the prostate enlarges away from the urethra, urine flow will not be blocked. If it enlarges toward the urethra and compresses it, there will be problems passing urine. What increases the risk? This condition is more likely to develop in men older than 50 years. What are the signs or symptoms? Symptoms of this condition include: Getting up often during the night to urinate. Needing to urinate frequently during the day. Difficulty starting urine flow. Decrease in size and strength of your urine stream. Leaking (dribbling) after urinating. Inability to pass urine. This needs immediate treatment. Inability to completely empty your bladder. Pain when you pass urine. This is more common if there is also an infection. Urinary tract infection (UTI). How is this diagnosed? This condition is diagnosed based on your medical history, a physical exam, and your symptoms. Tests will also be done, such as: A post-void bladder  scan. This measures any amount of urine that may remain in your bladder after you finish urinating. A digital rectal exam. In a rectal exam, your health care provider checks your prostate by putting a lubricated, gloved finger into your rectum to feel the back of your prostate gland. This exam detects the size of your gland and any  abnormal lumps or growths. An exam of your urine (urinalysis). A prostate specific antigen (PSA) screening. This is a blood test used to screen for prostate cancer. An ultrasound. This test uses sound waves to electronically produce a picture of your prostate gland. Your health care provider may refer you to a specialist in kidney and prostate diseases (urologist). How is this treated? Once symptoms begin, your health care provider will monitor your condition (active surveillance or watchful waiting). Treatment for this condition will depend on the severity of your condition. Treatment may include: Observation and yearly exams. This may be the only treatment needed if your condition and symptoms are mild. Medicines to relieve your symptoms, including: Medicines to shrink the prostate. Medicines to relax the muscle of the prostate. Surgery in severe cases. Surgery may include: Prostatectomy. In this procedure, the prostate tissue is removed completely through an open incision or with a laparoscope or robotics. Transurethral resection of the prostate (TURP). In this procedure, a tool is inserted through the opening at the tip of the penis (urethra). It is used to cut away tissue of the inner core of the prostate. The pieces are removed through the same opening of the penis. This removes the blockage. Transurethral incision (TUIP). In this procedure, small cuts are made in the prostate. This lessens the prostate's pressure on the urethra. Transurethral microwave thermotherapy (TUMT). This procedure uses microwaves to create heat. The heat destroys and removes a small amount of prostate tissue. Transurethral needle ablation (TUNA). This procedure uses radio frequencies to destroy and remove a small amount of prostate tissue. Interstitial laser coagulation ([place]). This procedure uses a laser to destroy and remove a small amount of prostate tissue. Transurethral electrovaporization (TUVP). This procedure  uses electrodes to destroy and remove a small amount of prostate tissue. Prostatic urethral lift. This procedure inserts an implant to push the lobes of the prostate away from the urethra. Follow these instructions at home: Take over-the-counter and prescription medicines only as told by your health care provider. Monitor your symptoms for any changes. Contact your health care provider with any changes. Avoid drinking large amounts of liquid before going to bed or out in public. Avoid or reduce how much caffeine or alcohol you drink. Give yourself time when you urinate. Keep all follow-up visits. This is important. Contact a health care provider if: You have unexplained back pain. Your symptoms do not get better with treatment. You develop side effects from the medicine you are taking. Your urine becomes very dark or has a bad smell. Your lower abdomen becomes distended and you have trouble passing urine. Get help right away if: You have a fever or chills. You suddenly cannot urinate. You feel light-headed or very dizzy, or you faint. There are large amounts of blood or clots in your urine. Your urinary problems become hard to manage. You develop moderate to severe low back or flank pain. The flank is the side of your body between the ribs and the hip. These symptoms may be an emergency. Get help right away. Call 911. Do not wait to see if the symptoms will go away. Do not drive yourself  to the hospital. Summary Benign prostatic hyperplasia (BPH) is an enlarged prostate that is caused by the normal aging process. It is not caused by cancer. An enlarged prostate can press on the urethra. This can make it hard to pass urine. This condition is more likely to develop in men older than 50 years. Get help right away if you suddenly cannot urinate. This information is not intended to replace advice given to you by your health care provider. Make sure you discuss any questions you have with  your health care provider. Document Revised: 04/11/2021 Document Reviewed: 04/11/2021 Elsevier Patient Education  [place].

## 2022-12-03 NOTE — Progress Notes (Signed)
12/03/22 2:48 PM   Harold Nelson [birth year] [id number]  CC: Lower urinary symptoms, dysuria, pyuria  HPI: 88 year old very healthy male who reports at least a few months of urinary symptoms including frequency, nocturia 2-4 times at night, occasional dysuria and discharge, mild stress incontinence with bending or heavy lifting.  Recent UA with PCP showed significant pyuria and was nitrite positive, but culture was negative.  He has had similar episodes in the past that were diagnosed as prostatitis and resolved with antibiotics.  He denies any gross hematuria.  He has never been on Flomax or any other prostate medications.  No recent antibiotic use.  Urinalysis today 6-10 WBC, 0 RBC, many bacteria, small leukocytes, nitrite positive.  Will send for culture and atypicals.  Recent PSA normal at 1.74   PMH: Past Medical History:  Diagnosis Date   Actinic keratosis    Dislocated shoulder    Hemorrhoids    Hyperlipidemia    Shingles 2012   Sleep apnea    No C-PAP, changed sleep position   Squamous cell carcinoma of skin 09/16/2013   Left forearm. SCCis.    Squamous cell carcinoma of skin 01/13/2018   Left mid vertex. MD SCC.    Squamous cell carcinoma of skin 05/25/2018   Vertex crown/ant. scalp. WD SCC with superficial infiltration.    Squamous cell carcinoma of skin 08/02/2020   Superior sternum - ED&C    Ulnar neuropathy     Surgical History: Past Surgical History:  Procedure Laterality Date   BACK SURGERY     back surgery microdisectomy     broken right elbow     COLONOSCOPY     COLONOSCOPY WITH PROPOFOL N/A 05/07/2016   Procedure: COLONOSCOPY WITH PROPOFOL;  Surgeon: Lollie Sails, MD;  Location: [facility] ENDOSCOPY;  Service: Endoscopy;  Laterality: N/A;   FRACTURE SURGERY     JOINT REPLACEMENT Bilateral    left knee arthroscopy     left ulnar nerve surgery     removal of benign growth Left    behind ear   TOTAL HIP ARTHROPLASTY Right 08/12/2017   Procedure: TOTAL  HIP ARTHROPLASTY ANTERIOR APPROACH;  Surgeon: Hessie Knows, MD;  Location: [facility] ORS;  Service: Orthopedics;  Laterality: Right;    Family History: No family history on file.  Social History:  reports that he has never smoked. He has never used smokeless tobacco. He reports current alcohol use of about 1.0 standard drink of alcohol per week. He reports that he does not use drugs.  Physical Exam: BP (!) 148/96 (BP Location: Left Arm, Patient Position: Sitting, Cuff Size: Normal)   Pulse 72   Ht '5\' 6"'$  (1.676 m)   Wt 175 lb (79.4 kg)   BMI 28.25 kg/m    Constitutional:  Alert and oriented, No acute distress. Cardiovascular: No clubbing, cyanosis, or edema. Respiratory: Normal respiratory effort, no increased work of breathing. GI: Abdomen is soft, nontender, nondistended, no abdominal masses  Assessment & Plan:   88 year old male with bothersome urinary symptoms of frequency, nocturia 2-4 times overnight, occasional dysuria and discharge, as well as some stress incontinence, and mildly elevated PVR of 287m in clinic today.  Multiple urine samples with pyuria and nitrite positive concerning for infection, but cultures have been negative.  Urine sent for cultures and atypicals today.  His symptoms were most consistent with prostatitis, we also discussed BPH as possible etiology, and other more rare possibilities like urethral stricture or bladder lesion.  I think it is reasonable for  a trial of longer course of antibiotics since he has improved with this previously, and trial of Flomax, with close follow-up for repeat PVR.  Would consider cystoscopy in the future if persistent symptoms or incomplete emptying.  Bactrim DS twice daily x 4 weeks, trial of Flomax Follow-up urine cultures RTC 6 to 8 weeks repeat PVR and symptom check, consider cystoscopy/TRUS if persistent symptoms  Nickolas Madrid, MD 12/03/2022  [facility] [address] [place], [place]  [address] [phone]

## 2022-12-05 LAB — URINE CULTURE

## 2022-12-11 ENCOUNTER — Ambulatory Visit (INDEPENDENT_AMBULATORY_CARE_PROVIDER_SITE_OTHER): Payer: Medicare Other | Admitting: Dermatology

## 2022-12-11 ENCOUNTER — Encounter: Payer: Self-pay | Admitting: Dermatology

## 2022-12-11 VITALS — BP 109/68 | HR 76

## 2022-12-11 DIAGNOSIS — Z7189 Other specified counseling: Secondary | ICD-10-CM

## 2022-12-11 DIAGNOSIS — L820 Inflamed seborrheic keratosis: Secondary | ICD-10-CM

## 2022-12-11 DIAGNOSIS — L814 Other melanin hyperpigmentation: Secondary | ICD-10-CM | POA: Diagnosis not present

## 2022-12-11 DIAGNOSIS — L578 Other skin changes due to chronic exposure to nonionizing radiation: Secondary | ICD-10-CM

## 2022-12-11 DIAGNOSIS — L570 Actinic keratosis: Secondary | ICD-10-CM | POA: Diagnosis not present

## 2022-12-11 NOTE — Patient Instructions (Addendum)
Photodynamic Therapyred Light Therapy  Actinic keratoses are the dry, red scaly spots on the skin caused by sun damage. A portion of these spots can turn into skin cancer with time, and treating them can help prevent development of skin cancer.   Treatment of these spots requires removal of the defective skin cells. There are various ways to remove actinic keratoses, including freezing with liquid nitrogen, treatment with creams, or treatment with a blue light procedure in the office.   Photodynamic Therapy (PDT), also known as Red light therapy" is an in office procedure used to treat actinic keratoses. It works by targeting precancerous cells. After treatment, these cells peel off and are replaced by healthy ones.   For your phototherapy appointment, you will have two appointments on the day of your treatment. The first appointment will be to apply a cream to the treatment area. You will leave this cream on for 1-2 hours depending on the area being treated. The second appointment will be to shine a Red light on the area for 16 minutes to kill off the precancer cells. It is common to experience a burning sensation during the treatment.  After your treatment, it will be important to keep the treated areas of skin out of the sun completely for 48-72 hours (2-3 days) to prevent having a reaction.   Common side effects include: - Burning or stinging, which may be severe and can last up to 24-72 hours after your treatment - Scaling and crusting which may last up to 2 weeks - Redness, swelling and/or peeling which can last up to 4 weeks  To Care for Your Skin After PDT/Red Light Therapy: - Wash with soap, water and shampoo as normal. - If needed, you can use cold compresses (e.g. ice packs) for comfort - If okay with your primary care doctor, you may use analgesics such as acetaminophen (tylenol) every 4-6 hours, not to exceed recommended dose - You may apply Cerave Healing Ointment, Vaseline or  Aquaphor as needed - If you have a lot of swelling you may take a Benadryl to help with this (this may cause drowsiness), not to exceed recommended dose. This may increase the risk of falls in people over 65 and may slow reaction time while driving, so it is not recommended to take before driving or operating machinery. - Sun Precautions - Wear a wide brim hat for the next week if outside  - Wear a sunblock with zinc or titanium dioxide at least SPF 50 daily  If you have any questions or concerns, please call the office and ask to speak with a nurse.   --------------------------------------------------------------------------------------------------------------    Actinic keratoses are precancerous spots that appear secondary to cumulative UV radiation exposure/sun exposure over time. They are chronic with expected duration over 1 year. A portion of actinic keratoses will progress to squamous cell carcinoma of the skin. It is not possible to reliably predict which spots will progress to skin cancer and so treatment is recommended to prevent development of skin cancer.  Recommend daily broad spectrum sunscreen SPF 30+ to sun-exposed areas, reapply every 2 hours as needed.  Recommend staying in the shade or wearing long sleeves, sun glasses (UVA+UVB protection) and wide brim hats (4-inch brim around the entire circumference of the hat). Call for new or changing lesions.   Cryotherapy Aftercare  Wash gently with soap and water everyday.   Apply Vaseline and Band-Aid daily until healed.     Due to recent changes in  healthcare laws, you may see results of your pathology and/or laboratory studies on MyChart before the doctors have had a chance to review them. We understand that in some cases there may be results that are confusing or concerning to you. Please understand that not all results are received at the same time and often the doctors may need to interpret multiple results in order to provide  you with the best plan of care or course of treatment. Therefore, we ask that you please give [place] 2 business days to thoroughly review all your results before contacting the office for clarification. Should we see a critical lab result, you will be contacted sooner.   If You Need Anything After Your Visit  If you have any questions or concerns for your doctor, please call our main line at [phone] and press option 4 to reach your doctor's medical assistant. If no one answers, please leave a voicemail as directed and we will return your call as soon as possible. Messages left after 4 pm will be answered the following business day.   You may also send [place] a message via [place]. We typically respond to MyChart messages within 1-2 business days.  For prescription refills, please ask your pharmacy to contact our office. Our fax number is [phone].  If you have an urgent issue when the clinic is closed that cannot wait until the next business day, you can page your doctor at the number below.    Please note that while we do our best to be available for urgent issues outside of office hours, we are not available 24/7.   If you have an urgent issue and are unable to reach [place], you may choose to seek medical care at your doctor's office, retail clinic, urgent care center, or emergency room.  If you have a medical emergency, please immediately call 911 or go to the emergency department.  Pager Numbers  - Dr. Nehemiah Massed: [phone]  - Dr. Laurence Ferrari: [phone]  - Dr. Nicole Kindred: [phone]  In the event of inclement weather, please call our main line at [phone] for an update on the status of any delays or closures.  Dermatology Medication Tips: Please keep the boxes that topical medications come in in order to help keep track of the instructions about where and how to use these. Pharmacies typically print the medication instructions only on the boxes and not directly on the medication tubes.    If your medication is too expensive, please contact our office at [phone] option 4 or send [place] a message through [place].   We are unable to tell what your co-pay for medications will be in advance as this is different depending on your insurance coverage. However, we may be able to find a substitute medication at lower cost or fill out paperwork to get insurance to cover a needed medication.   If a prior authorization is required to get your medication covered by your insurance company, please allow [place] 1-2 business days to complete this process.  Drug prices often vary depending on where the prescription is filled and some pharmacies may offer cheaper prices.  The website www.goodrx.com contains coupons for medications through different pharmacies. The prices here do not account for what the cost may be with help from insurance (it may be cheaper with your insurance), but the website can give you the price if you did not use any insurance.  - You can print the associated coupon and take it with your prescription to the  pharmacy.  - You may also stop by our office during regular business hours and pick up a GoodRx coupon card.  - If you need your prescription sent electronically to a different pharmacy, notify our office through [facility] or by phone at [phone] option 4.     Si Usted Necesita Algo Despus de Su Visita  Tambin puede enviarnos un mensaje a travs de Pharmacist, community. Por lo general respondemos a los mensajes de MyChart en el transcurso de 1 a 2 das hbiles.  Para renovar recetas, por favor pida a su farmacia que se ponga en contacto con nuestra oficina. Harland Dingwall de fax es [place] [phone].  Si tiene un asunto urgente cuando la clnica est cerrada y que no puede esperar hasta el siguiente da hbil, puede llamar/localizar a su doctor(a) al nmero que aparece a continuacin.   Por favor, tenga en cuenta que aunque hacemos todo lo posible para estar  disponibles para asuntos urgentes fuera del horario de [place], no estamos disponibles las 24 horas del da, los 7 das de la [place].   Si tiene un problema urgente y no puede comunicarse con nosotros, puede optar por buscar atencin mdica  en el consultorio de su doctor(a), en una clnica privada, en un centro de atencin urgente o en una sala de emergencias.  Si tiene Engineering geologist, por favor llame inmediatamente al 911 o vaya a la sala de emergencias.  Nmeros de bper  - Dr. Nehemiah Massed: [phone]  - Dra. Moye: [phone]  - Dra. Nicole Kindred: [phone]  En caso de inclemencias del [place], por favor llame a Johnsie Kindred principal al [phone] para una actualizacin sobre el [place] de cualquier retraso o cierre.  Consejos para la medicacin en dermatologa: Por favor, guarde las cajas en las que vienen los medicamentos de uso tpico para ayudarle a seguir las instrucciones sobre dnde y cmo usarlos. Las farmacias generalmente imprimen las instrucciones del medicamento slo en las cajas y no directamente en los tubos del [place].   Si su medicamento es muy caro, por favor, pngase en contacto con Zigmund Daniel llamando al [phone] y presione la opcin 4 o envenos un mensaje a travs de Pharmacist, community.   No podemos decirle cul ser su copago por los medicamentos por adelantado ya que esto es diferente dependiendo de la cobertura de su seguro. Sin embargo, es posible que podamos encontrar un medicamento sustituto a Electrical engineer un formulario para que el seguro cubra el medicamento que se considera necesario.   Si se requiere una autorizacin previa para que su compaa de seguros [place] su medicamento, por favor permtanos de 1 a 2 das hbiles para completar este proceso.  Los precios de los medicamentos varan con frecuencia dependiendo del Environmental consultant de dnde se surte la receta y alguna farmacias pueden ofrecer precios ms baratos.  El sitio web www.goodrx.com tiene  cupones para medicamentos de Airline pilot. Los precios aqu no tienen en cuenta lo que podra costar con la ayuda del seguro (puede ser ms barato con su seguro), pero el sitio web puede darle el precio si no utiliz Research scientist (physical sciences).  - Puede imprimir el cupn correspondiente y llevarlo con su receta a la farmacia.  - Tambin puede pasar por nuestra oficina durante el horario de atencin regular y Charity fundraiser una tarjeta de cupones de GoodRx.  - Si necesita que su receta se enve electrnicamente a Chiropodist, informe a nuestra oficina a travs de MyChart de  o por telfono llamando  al [phone] y presione la opcin 4.

## 2022-12-11 NOTE — Progress Notes (Signed)
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 Reports has had blue light treatment to scalp in December. ). The patient has spots, moles and lesions to be evaluated, some may be new or changing and the patient has concerns that these could be cancer.  The following portions of the chart were reviewed this encounter and updated as appropriate:  Tobacco  Allergies  Meds  Problems  Med Hx  Surg Hx  Fam Hx     Review of Systems: No other skin or systemic complaints except as noted in HPI or Assessment and Plan.  Objective  Well appearing patient in no apparent distress; mood and affect are within normal limits.  A focused examination was performed including face and scalp. Relevant physical exam findings are noted in the Assessment and Plan.  Scalp/face x 16 (16) Erythematous thin papules/macules with gritty scale.   scalp x 2 (2) Erythematous stuck-on, waxy papule or plaque   Assessment & Plan  Actinic keratosis (16) Scalp/face x 16 Recommend -Red Light photodynamic therapy with debridement to the scalp in 6 weeks.   Actinic keratoses are precancerous spots that appear secondary to cumulative UV radiation exposure/sun exposure over time. They are chronic with expected duration over 1 year. A portion of actinic keratoses will progress to squamous cell carcinoma of the skin. It is not possible to reliably predict which spots will progress to skin cancer and so treatment is recommended to prevent development of skin cancer.  Recommend daily broad spectrum sunscreen SPF 30+ to sun-exposed areas, reapply every 2 hours as needed.  Recommend staying in the shade or wearing long sleeves, sun glasses (UVA+UVB protection) and wide brim hats (4-inch brim around the entire circumference of the hat). Call for new or changing lesions.  Destruction of lesion -  Scalp/face x 16 Complexity: simple   Destruction method: cryotherapy   Informed consent: discussed and consent obtained   Timeout:  patient name, date of birth, surgical site, and procedure verified Lesion destroyed using liquid nitrogen: Yes   Region frozen until ice ball extended beyond lesion: Yes   Outcome: patient tolerated procedure well with no complications   Post-procedure details: wound care instructions given   Additional details:  Prior to procedure, discussed risks of blister formation, small wound, skin dyspigmentation, or rare scar following cryotherapy. Recommend Vaseline ointment to treated areas while healing.  Inflamed seborrheic keratosis (2) scalp x 2 Symptomatic, irritating, patient would like treated. Destruction of lesion - scalp x 2 Complexity: simple   Destruction method: cryotherapy   Informed consent: discussed and consent obtained   Timeout:  patient name, date of birth, surgical site, and procedure verified Lesion destroyed using liquid nitrogen: Yes   Region frozen until ice ball extended beyond lesion: Yes   Outcome: patient tolerated procedure well with no complications   Post-procedure details: wound care instructions given    Seborrheic Keratoses - Stuck-on, waxy, tan-brown papules and/or plaques  - Benign-appearing - Discussed benign etiology and prognosis. - Observe - Call for any changes  Lentigines - Scattered tan macules - Due to sun exposure - Benign-appearing, observe - Recommend daily broad spectrum sunscreen SPF 30+ to sun-exposed areas, reapply every 2 hours as needed. - Call for any changes  Actinic Damage with PreCancerous Actinic Keratoses Counseling for Topical Chemotherapy Management: Patient exhibits: - Severe, confluent actinic changes with pre-cancerous  actinic keratoses that is secondary to cumulative UV radiation exposure over time - Condition that is severe; chronic, not at goal. - diffuse scaly erythematous macules  and papules with underlying dyspigmentation - Discussed Prescription "Field Treatment" topical Chemotherapy for Severe, Chronic Confluent Actinic Changes with Pre-Cancerous Actinic Keratoses Field treatment involves treatment of an entire area of skin that has confluent Actinic Changes (Sun/ Ultraviolet light damage) and PreCancerous Actinic Keratoses by method of PhotoDynamic Therapy (PDT) and/or prescription Topical Chemotherapy agents such as 5-fluorouracil, 5-fluorouracil/calcipotriene, and/or imiquimod.  The purpose is to decrease the number of clinically evident and subclinical PreCancerous lesions to prevent progression to development of skin cancer by chemically destroying early precancer changes that may or may not be visible.  It has been shown to reduce the risk of developing skin cancer in the treated area. As a result of treatment, redness, scaling, crusting, and open sores may occur during treatment course. One or more than one of these methods may be used and may have to be used several times to control, suppress and eliminate the PreCancerous changes. Discussed treatment course, expected reaction, and possible side effects. - Recommend daily broad spectrum sunscreen SPF 30+ to sun-exposed areas, reapply every 2 hours as needed.  - Staying in the shade or wearing long sleeves, sun glasses (UVA+UVB protection) and wide brim hats (4-inch brim around the entire circumference of the hat) are also recommended. - Call for new or changing lesions.  - Will schedule Red Light photodynamic therapy with debridement to the scalp  Return for keep appointment in october .  IRuthell Rummage, CMA, am acting as scribe for Sarina Ser, MD. Documentation: I have reviewed the above documentation for accuracy and completeness, and I agree with the above.  Sarina Ser, MD

## 2022-12-15 ENCOUNTER — Encounter: Payer: Self-pay | Admitting: Dermatology

## 2023-01-14 ENCOUNTER — Encounter: Payer: Self-pay | Admitting: Urology

## 2023-01-14 ENCOUNTER — Ambulatory Visit (INDEPENDENT_AMBULATORY_CARE_PROVIDER_SITE_OTHER): Payer: Medicare Other | Admitting: Urology

## 2023-01-14 VITALS — BP 149/89 | HR 69 | Ht 66.0 in | Wt 179.8 lb

## 2023-01-14 DIAGNOSIS — N401 Enlarged prostate with lower urinary tract symptoms: Secondary | ICD-10-CM | POA: Diagnosis not present

## 2023-01-14 DIAGNOSIS — N390 Urinary tract infection, site not specified: Secondary | ICD-10-CM

## 2023-01-14 DIAGNOSIS — N138 Other obstructive and reflux uropathy: Secondary | ICD-10-CM

## 2023-01-14 LAB — BLADDER SCAN AMB NON-IMAGING

## 2023-01-14 NOTE — Progress Notes (Signed)
   01/14/2023 1:20 PM   Infinite Naylor [birth year] [id number]  Reason for visit: Follow up prostatitis, BPH, incomplete bladder emptying  HPI: 88 year old male who I originally saw in February 2024 for a few months of urinary symptoms including frequency, nocturia 2-4 times at night, occasional dysuria and discharge, mild stress incontinence.  UA with PCP showed significant pyuria and was nitrite positive but culture was negative.  He had similar episodes previously that were diagnosed as prostatitis and resolved with antibiotics.  He has never had gross hematuria.  UA at that visit was suspicious with pyuria and many bacteria but no microscopic hematuria, and he was started on 4 weeks of Bactrim for suspected prostatitis, as well as Flomax for mildly elevated PVR of .  Urine culture ultimately grew multiple species.  PSA was normal in February 2024 at 1.74.  He reports significant improvement after the month of Bactrim and starting the Flomax.  Dysuria has resolved, nocturia improved to 1-2 times overnight, strength of stream improved significantly.  PVR improved today to from prior to starting Flomax.  Overall very satisfied with his symptoms at this time.  We discussed options like cystoscopy or TRUS/CT for further evaluation of his history of infections as well as urinary symptoms, however he would like to hold off at this time and continue the Flomax as he denies any problems currently.  Return precautions discussed at length including recurrent infections or gross hematuria, and if recurrent infections would again strongly recommend cystoscopy/CT urogram for evaluation of other possible etiologies including obstruction, stones, tumors, and considering outlet procedures.  Continue Flomax RTC 1 year PVR, sooner if problems   Sondra Come, MD  [facility] [address], [address] [place], [place] [address] [phone]

## 2023-01-23 ENCOUNTER — Ambulatory Visit (INDEPENDENT_AMBULATORY_CARE_PROVIDER_SITE_OTHER): Payer: Medicare Other | Admitting: Dermatology

## 2023-01-23 DIAGNOSIS — L570 Actinic keratosis: Secondary | ICD-10-CM

## 2023-01-23 MED ORDER — AMINOLEVULINIC ACID HCL 10 % EX GEL
2000.0000 mg | Freq: Once | CUTANEOUS | Status: AC
  Administered 2023-01-23: 2000 mg via TOPICAL

## 2023-01-23 NOTE — Patient Instructions (Signed)

## 2023-01-23 NOTE — Progress Notes (Signed)
Patient completed red light phototherapy today.  ACTINIC KERATOSES Exam: Erythematous thin papules/macules with gritty scale.  Treatment Plan:  Red Light Photodynamic therapy  Procedure discussed: discussed risks, benefits, side effects. and alternatives   Prep: site scrubbed/prepped with acetone   Debridement needed: Yes (performed by Physician with sand paper.  (CPT [id number])) Location:  scalp Number of lesions:  Multiple (> 15) Type of treatment:  Red light Aminolevulinic Acid (see MAR for details): Ameluz Aminolevulinic Acid comment:  [id number] Amount of Ameluz (mg):  1 Incubation time (minutes):  120 Number of minutes under lamp:  10 Cooling:  Fan Outcome: patient tolerated procedure well with no complications   Post-procedure details: sunscreen applied and aftercare instructions given to patient    Related Medications Aminolevulinic Acid HCl 10 % GEL 2,000 mg  VIRGINA Dempsy Damiano  I personally debrided area prior to application of aminolevulinic acid  Documentation: I have reviewed the above documentation for accuracy and completeness, and I agree with the above.  Darden Dates, MD

## 2023-08-07 ENCOUNTER — Ambulatory Visit: Payer: Medicare Other | Admitting: Dermatology

## 2023-08-07 ENCOUNTER — Encounter: Payer: Self-pay | Admitting: Dermatology

## 2023-08-07 DIAGNOSIS — Z8589 Personal history of malignant neoplasm of other organs and systems: Secondary | ICD-10-CM

## 2023-08-07 DIAGNOSIS — D1801 Hemangioma of skin and subcutaneous tissue: Secondary | ICD-10-CM

## 2023-08-07 DIAGNOSIS — Z872 Personal history of diseases of the skin and subcutaneous tissue: Secondary | ICD-10-CM

## 2023-08-07 DIAGNOSIS — L814 Other melanin hyperpigmentation: Secondary | ICD-10-CM

## 2023-08-07 DIAGNOSIS — L820 Inflamed seborrheic keratosis: Secondary | ICD-10-CM | POA: Diagnosis not present

## 2023-08-07 DIAGNOSIS — D229 Melanocytic nevi, unspecified: Secondary | ICD-10-CM

## 2023-08-07 DIAGNOSIS — L578 Other skin changes due to chronic exposure to nonionizing radiation: Secondary | ICD-10-CM

## 2023-08-07 DIAGNOSIS — L570 Actinic keratosis: Secondary | ICD-10-CM

## 2023-08-07 DIAGNOSIS — Z85828 Personal history of other malignant neoplasm of skin: Secondary | ICD-10-CM

## 2023-08-07 DIAGNOSIS — B353 Tinea pedis: Secondary | ICD-10-CM | POA: Diagnosis not present

## 2023-08-07 DIAGNOSIS — Z7189 Other specified counseling: Secondary | ICD-10-CM

## 2023-08-07 DIAGNOSIS — Z1283 Encounter for screening for malignant neoplasm of skin: Secondary | ICD-10-CM | POA: Diagnosis not present

## 2023-08-07 DIAGNOSIS — Z79899 Other long term (current) drug therapy: Secondary | ICD-10-CM

## 2023-08-07 DIAGNOSIS — W908XXA Exposure to other nonionizing radiation, initial encounter: Secondary | ICD-10-CM

## 2023-08-07 DIAGNOSIS — L821 Other seborrheic keratosis: Secondary | ICD-10-CM

## 2023-08-07 MED ORDER — KETOCONAZOLE 2 % EX CREA: TOPICAL_CREAM | CUTANEOUS | 11 refills | Status: DC

## 2023-08-07 NOTE — Progress Notes (Signed)
Follow-Up Visit   Subjective  Harold Nelson is a 88 y.o. male who presents for the following: Skin Cancer Screening and Full Body Skin Exam. Hx of SCCs. Hx of AKs. Concerned about spots on scalp, face, and neck. Had red light treatment with debridement on scalp 01/23/2023.  The patient presents for Total-Body Skin Exam (TBSE) for skin cancer screening and mole check. The patient has spots, moles and lesions to be evaluated, some may be new or changing and the patient may have concern these could be cancer.    The following portions of the chart were reviewed this encounter and updated as appropriate: medications, allergies, medical history  Review of Systems:  No other skin or systemic complaints except as noted in HPI or Assessment and Plan.  Objective  Well appearing patient in no apparent distress; mood and affect are within normal limits.  A full examination was performed including scalp, head, eyes, ears, nose, lips, neck, chest, axillae, abdomen, back, buttocks, bilateral upper extremities, bilateral lower extremities, hands, feet, fingers, toes, fingernails, and toenails. All findings within normal limits unless otherwise noted below.   Relevant physical exam findings are noted in the Assessment and Plan.  26 (26) Erythematous thin papules/macules with gritty scale.   L inferior cheek x2 (2) Erythematous keratotic or waxy stuck-on papule or plaque.    Assessment & Plan    HISTORY OF SQUAMOUS CELL CARCINOMA OF THE SKIN. Multiple sites, see history. - No evidence of recurrence today - No lymphadenopathy - Recommend regular full body skin exams - Recommend daily broad spectrum sunscreen SPF 30+ to sun-exposed areas, reapply every 2 hours as needed.  - Call if any new or changing lesions are noted between office visits     SKIN CANCER SCREENING PERFORMED TODAY.  ACTINIC DAMAGE - Chronic condition, secondary to cumulative UV/sun exposure - diffuse scaly erythematous  macules with underlying dyspigmentation - Recommend daily broad spectrum sunscreen SPF 30+ to sun-exposed areas, reapply every 2 hours as needed.  - Staying in the shade or wearing long sleeves, sun glasses (UVA+UVB protection) and wide brim hats (4-inch brim around the entire circumference of the hat) are also recommended for sun protection.  - Call for new or changing lesions.  LENTIGINES, SEBORRHEIC KERATOSES, HEMANGIOMAS - Benign normal skin lesions - Benign-appearing - Call for any changes  MELANOCYTIC NEVI - Tan-brown and/or pink-flesh-colored symmetric macules and papules - Benign appearing on exam today - Observation - Call clinic for new or changing moles - Recommend daily use of broad spectrum spf 30+ sunscreen to sun-exposed areas.    TINEA PEDIS Exam: Scaling and maceration web spaces and over distal and lateral soles.  Chronic and persistent condition with duration or expected duration over one year. Condition is symptomatic / bothersome to patient. Not to goal.   Treatment Plan: Start Ketoconazole 2% cream at bedtime to feet until clear. Resume as needed for recurrence.     AK (actinic keratosis) (26) 26  Actinic keratoses are precancerous spots that appear secondary to cumulative UV radiation exposure/sun exposure over time. They are chronic with expected duration over 1 year. A portion of actinic keratoses will progress to squamous cell carcinoma of the skin. It is not possible to reliably predict which spots will progress to skin cancer and so treatment is recommended to prevent development of skin cancer.  Recommend daily broad spectrum sunscreen SPF 30+ to sun-exposed areas, reapply every 2 hours as needed.  Recommend staying in the shade or wearing long sleeves,  sun glasses (UVA+UVB protection) and wide brim hats (4-inch brim around the entire circumference of the hat). Call for new or changing lesions.  Destruction of lesion - 26 (26) Complexity: simple    Destruction method: cryotherapy   Informed consent: discussed and consent obtained   Timeout:  patient name, date of birth, surgical site, and procedure verified Lesion destroyed using liquid nitrogen: Yes   Region frozen until ice ball extended beyond lesion: Yes   Outcome: patient tolerated procedure well with no complications   Post-procedure details: wound care instructions given   Additional details:  Prior to procedure, discussed risks of blister formation, small wound, skin dyspigmentation, or rare scar following cryotherapy. Recommend Vaseline ointment to treated areas while healing.   Inflamed seborrheic keratosis (2) L inferior cheek x2  Symptomatic, irritating, patient would like treated.  Destruction of lesion - L inferior cheek x2 (2) Complexity: simple   Destruction method: cryotherapy   Informed consent: discussed and consent obtained   Timeout:  patient name, date of birth, surgical site, and procedure verified Lesion destroyed using liquid nitrogen: Yes   Region frozen until ice ball extended beyond lesion: Yes   Outcome: patient tolerated procedure well with no complications   Post-procedure details: wound care instructions given   Additional details:  Prior to procedure, discussed risks of blister formation, small wound, skin dyspigmentation, or rare scar following cryotherapy. Recommend Vaseline ointment to treated areas while healing.    Return in about 7 months (around 03/06/2024) for AK Follow Up, UBSE.  I, Lawson Radar, CMA, am acting as scribe for Armida Sans, MD.   Documentation: I have reviewed the above documentation for accuracy and completeness, and I agree with the above.  Armida Sans, MD

## 2023-08-07 NOTE — Patient Instructions (Addendum)
Cryotherapy Aftercare  Wash gently with soap and water everyday.   Apply Vaseline Jelly daily until healed.    Start Ketoconazole 2% cream at bedtime to feet until clear. Resume as needed for recurrence.    Recommend daily broad spectrum sunscreen SPF 30+ to sun-exposed areas, reapply every 2 hours as needed. Call for new or changing lesions.  Staying in the shade or wearing long sleeves, sun glasses (UVA+UVB protection) and wide brim hats (4-inch brim around the entire circumference of the hat) are also recommended for sun protection.     Melanoma ABCDEs  Melanoma is the most dangerous type of skin cancer, and is the leading cause of death from skin disease.  You are more likely to develop melanoma if you: Have light-colored skin, light-colored eyes, or red or blond hair Spend a lot of time in the sun Tan regularly, either outdoors or in a tanning bed Have had blistering sunburns, especially during childhood Have a close family member who has had a melanoma Have atypical moles or large birthmarks  Early detection of melanoma is key since treatment is typically straightforward and cure rates are extremely high if we catch it early.   The first sign of melanoma is often a change in a mole or a new dark spot.  The ABCDE system is a way of remembering the signs of melanoma.  A for asymmetry:  The two halves do not match. B for border:  The edges of the growth are irregular. C for color:  A mixture of colors are present instead of an even brown color. D for diameter:  Melanomas are usually (but not always) greater than 6mm - the size of a pencil eraser. E for evolution:  The spot keeps changing in size, shape, and color.  Please check your skin once per month between visits. You can use a small mirror in front and a large mirror behind you to keep an eye on the back side or your body.   If you see any new or changing lesions before your next follow-up, please call to schedule a  visit.  Please continue daily skin protection including broad spectrum sunscreen SPF 30+ to sun-exposed areas, reapplying every 2 hours as needed when you're outdoors.   Staying in the shade or wearing long sleeves, sun glasses (UVA+UVB protection) and wide brim hats (4-inch brim around the entire circumference of the hat) are also recommended for sun protection.       Due to recent changes in healthcare laws, you may see results of your pathology and/or laboratory studies on MyChart before the doctors have had a chance to review them. We understand that in some cases there may be results that are confusing or concerning to you. Please understand that not all results are received at the same time and often the doctors may need to interpret multiple results in order to provide you with the best plan of care or course of treatment. Therefore, we ask that you please give [place] 2 business days to thoroughly review all your results before contacting the office for clarification. Should we see a critical lab result, you will be contacted sooner.   If You Need Anything After Your Visit  If you have any questions or concerns for your doctor, please call our main line at [phone] and press option 4 to reach your doctor's medical assistant. If no one answers, please leave a voicemail as directed and we will return your call as soon as possible. Messages  left after 4 pm will be answered the following business day.   You may also send [place] a message via MyChart. We typically respond to MyChart messages within 1-2 business days.  For prescription refills, please ask your pharmacy to contact our office. Our fax number is [phone].  If you have an urgent issue when the clinic is closed that cannot wait until the next business day, you can page your doctor at the number below.    Please note that while we do our best to be available for urgent issues outside of office hours, we are not available 24/7.    If you have an urgent issue and are unable to reach [place], you may choose to seek medical care at your doctor's office, retail clinic, urgent care center, or emergency room.  If you have a medical emergency, please immediately call 911 or go to the emergency department.  Pager Numbers  - Dr. Gwen Pounds: [phone]  - Dr. Roseanne Reno: [phone]  - Dr. Katrinka Blazing: [phone]   In the event of inclement weather, please call our main line at [phone] for an update on the status of any delays or closures.  Dermatology Medication Tips: Please keep the boxes that topical medications come in in order to help keep track of the instructions about where and how to use these. Pharmacies typically print the medication instructions only on the boxes and not directly on the medication tubes.   If your medication is too expensive, please contact our office at [phone] option 4 or send [place] a message through MyChart.   We are unable to tell what your co-pay for medications will be in advance as this is different depending on your insurance coverage. However, we may be able to find a substitute medication at lower cost or fill out paperwork to get insurance to cover a needed medication.   If a prior authorization is required to get your medication covered by your insurance company, please allow [place] 1-2 business days to complete this process.  Drug prices often vary depending on where the prescription is filled and some pharmacies may offer cheaper prices.  The website www.goodrx.com contains coupons for medications through different pharmacies. The prices here do not account for what the cost may be with help from insurance (it may be cheaper with your insurance), but the website can give you the price if you did not use any insurance.  - You can print the associated coupon and take it with your prescription to the pharmacy.  - You may also stop by our office during regular business hours and pick up a  GoodRx coupon card.  - If you need your prescription sent electronically to a different pharmacy, notify our office through [facility] or by phone at [phone] option 4.     Si Usted Necesita Algo Despus de Su Visita  Tambin puede enviarnos un mensaje a travs de Clinical cytogeneticist. Por lo general respondemos a los mensajes de MyChart en el transcurso de 1 a 2 das hbiles.  Para renovar recetas, por favor pida a su farmacia que se ponga en contacto con nuestra oficina. Annie Sable de fax es [place] [phone].  Si tiene un asunto urgente cuando la clnica est cerrada y que no puede esperar hasta el siguiente da hbil, puede llamar/localizar a su doctor(a) al nmero que aparece a continuacin.   Por favor, tenga en cuenta que aunque hacemos todo lo posible para estar disponibles para asuntos urgentes fuera del horario de [place], no  estamos disponibles las 24 horas del da, los 7 [address]  [address] de la [place].   Si tiene un problema urgente y no puede comunicarse con nosotros, puede optar por buscar atencin mdica  en el consultorio de su doctor(a), en una clnica privada, en un centro de atencin urgente o en una sala de emergencias.  Si tiene Engineer, drilling, por favor llame inmediatamente al 911 o vaya a la sala de emergencias.  Nmeros de bper  - Dr. Gwen Pounds: [phone]  - Dra. Roseanne Reno: [phone]  - Dr. Katrinka Blazing: [phone]   En caso de inclemencias del tiempo, por favor llame a Lacy Duverney principal al [phone] para una actualizacin sobre el [place] de cualquier retraso o cierre.  Consejos para la medicacin en dermatologa: Por favor, guarde las cajas en las que vienen los medicamentos de uso tpico para ayudarle a seguir las instrucciones sobre dnde y cmo usarlos. Las farmacias generalmente imprimen las instrucciones del medicamento slo en las cajas y no directamente en los tubos del [place].   Si su medicamento es muy caro, por favor, pngase en contacto  con Rolm Gala llamando al [phone] y presione la opcin 4 o envenos un mensaje a travs de Clinical cytogeneticist.   No podemos decirle cul ser su copago por los medicamentos por adelantado ya que esto es diferente dependiendo de la cobertura de su seguro. Sin embargo, es posible que podamos encontrar un medicamento sustituto a Audiological scientist un formulario para que el seguro cubra el medicamento que se considera necesario.   Si se requiere una autorizacin previa para que su compaa de seguros [place] su medicamento, por favor permtanos de 1 a 2 das hbiles para completar [address]  Los precios de los medicamentos varan con frecuencia dependiendo del Environmental consultant de dnde se surte la receta y alguna farmacias pueden ofrecer precios ms baratos.  El sitio web www.goodrx.com tiene cupones para medicamentos de Health and safety inspector. Los precios aqu no tienen en cuenta lo que podra costar con la ayuda del seguro (puede ser ms barato con su seguro), pero el sitio web puede darle el precio si no utiliz Tourist information centre manager.  - Puede imprimir el cupn correspondiente y llevarlo con su receta a la farmacia.  - Tambin puede pasar por nuestra oficina durante el horario de atencin regular y Education officer, museum una tarjeta de cupones de GoodRx.  - Si necesita que su receta se enve electrnicamente a una farmacia diferente, informe a nuestra oficina a travs de MyChart de [place] o por telfono llamando al [phone] y presione la opcin 4.

## 2023-09-26 ENCOUNTER — Telehealth: Payer: Self-pay | Admitting: *Deleted

## 2023-09-26 NOTE — Telephone Encounter (Signed)
Pt calling stating that he has a UA thru his PCP and he wanted Dr. Richardo Hanks to look at it. Advised pt that Dr. Richardo Hanks is out of office, pt states he will have his PCP look at it

## 2023-10-14 ENCOUNTER — Other Ambulatory Visit: Payer: Medicare Other

## 2023-10-14 ENCOUNTER — Ambulatory Visit (INDEPENDENT_AMBULATORY_CARE_PROVIDER_SITE_OTHER): Payer: Medicare Other | Admitting: Physician Assistant

## 2023-10-14 DIAGNOSIS — R300 Dysuria: Secondary | ICD-10-CM

## 2023-10-14 DIAGNOSIS — R3916 Straining to void: Secondary | ICD-10-CM | POA: Diagnosis not present

## 2023-10-14 LAB — MICROSCOPIC EXAMINATION
Bacteria, UA: NONE SEEN
RBC, Urine: NONE SEEN /[HPF] (ref 0–2)

## 2023-10-14 LAB — URINALYSIS, COMPLETE
Bilirubin, UA: NEGATIVE
Glucose, UA: NEGATIVE
Ketones, UA: NEGATIVE
Leukocytes,UA: NEGATIVE
Nitrite, UA: NEGATIVE
Protein,UA: NEGATIVE
RBC, UA: NEGATIVE
Specific Gravity, UA: 1.02 (ref 1.005–1.030)
Urobilinogen, Ur: 0.2 mg/dL (ref 0.2–1.0)
pH, UA: 6 (ref 5.0–7.5)

## 2023-10-14 LAB — BLADDER SCAN AMB NON-IMAGING: Scan Result: 205

## 2023-10-14 NOTE — Progress Notes (Signed)
 10/14/2023 11:49 AM   Harold Nelson [birth year] [id number]  CC: Chief Complaint  Patient presents with   Dysuria   HPI: Harold Nelson is a 89 y.o. male with PMH prostatitis, BPH, and incomplete bladder emptying who presents today for follow-up of UTI.   He developed fever, fatigue, and difficulty voiding about 2 weeks ago.  His PCP treated him with Cipro 250 mg twice daily x 10 days.  UA was notable for pyuria and culture grew tetracycline resistant E faecalis.  Today he reports his symptoms completely resolved on Cipro, which he completed about 5 days ago.  He has no acute concerns today.  In-office UA and microscopy pan negative. PVR .  PMH: Past Medical History:  Diagnosis Date   Actinic keratosis    Dislocated shoulder    Hemorrhoids    Hyperlipidemia    Shingles 2012   Sleep apnea    No C-PAP, changed sleep position   Squamous cell carcinoma of skin 09/16/2013   Left forearm. SCCis.    Squamous cell carcinoma of skin 01/13/2018   Left mid vertex. MD SCC.    Squamous cell carcinoma of skin 05/25/2018   Vertex crown/ant. scalp. WD SCC with superficial infiltration.    Squamous cell carcinoma of skin 08/02/2020   Superior sternum - ED&C    Ulnar neuropathy     Surgical History: Past Surgical History:  Procedure Laterality Date   BACK SURGERY     back surgery microdisectomy     broken right elbow     COLONOSCOPY     COLONOSCOPY WITH PROPOFOL  N/A 05/07/2016   Procedure: COLONOSCOPY WITH PROPOFOL ;  Surgeon: Gladis RAYMOND Mariner, MD;  Location: [facility] ENDOSCOPY;  Service: Endoscopy;  Laterality: N/A;   FRACTURE SURGERY     JOINT REPLACEMENT Bilateral    left knee arthroscopy     left ulnar nerve surgery     removal of benign growth Left    behind ear   TOTAL HIP ARTHROPLASTY Right 08/12/2017   Procedure: TOTAL HIP ARTHROPLASTY ANTERIOR APPROACH;  Surgeon: Kathlynn Sharper, MD;  Location: [facility] ORS;  Service: Orthopedics;  Laterality: Right;    Home Medications:   Allergies as of 10/14/2023   No Known Allergies      Medication List        Accurate as of October 14, 2023 11:49 AM. If you have any questions, ask your nurse or doctor.          Cholecalciferol 25 MCG (1000 UT) capsule Take 1,000 Units by mouth daily.   Coenzyme Q10 100 MG capsule Take 1 capsule by mouth daily.   dorzolamide-timolol  2-0.5 % ophthalmic solution Commonly known as: COSOPT Place 1 drop into the right eye 3 (three) times daily.   esomeprazole 40 MG capsule Commonly known as: NEXIUM Take 40 mg by mouth daily.   ketoconazole  2 % cream Commonly known as: NIZORAL  Apply to feet every night at bedtime until clear   MENS PROSTATE HEALTH FORMULA PO Take 1 capsule by mouth daily.   multivitamin tablet Take 1 tablet by mouth daily.   OMEGA-3 FISH OIL PO Take 1 capsule by mouth daily.   Red Yeast Rice Extract 600 MG Caps Take 2 capsules by mouth daily.   tamsulosin  0.4 MG Caps capsule Commonly known as: FLOMAX  Take 1 capsule (0.4 mg total) by mouth daily.   timolol  0.5 % ophthalmic solution Commonly known as: BETIMOL  Place 1 drop into the right eye daily.   Turmeric 450 MG Caps Take  1 capsule by mouth daily.        Allergies:  No Known Allergies  Family History: No family history on file.  Social History:   reports that he has never smoked. He has never used smokeless tobacco. He reports current alcohol use of about 1.0 standard drink of alcohol per week. He reports that he does not use drugs.  Physical Exam: There were no vitals taken for this visit.  Constitutional:  Alert and oriented, no acute distress, nontoxic appearing HEENT: [place], AT Cardiovascular: No clubbing, cyanosis, or edema Respiratory: Normal respiratory effort, no increased work of breathing Skin: No rashes, bruises or suspicious lesions Neurologic: Grossly intact, no focal deficits, moving all 4 extremities Psychiatric: Normal mood and affect  Laboratory Data: Results  for orders placed or performed in visit on 10/14/23  Microscopic Examination   Collection Time: 10/14/23 10:56 AM   Urine  Result Value Ref Range   WBC, UA 0-5 0 - 5 /hpf   RBC, Urine None seen 0 - 2 /hpf   Epithelial Cells (non renal) 0-10 0 - 10 /hpf   Bacteria, UA None seen None seen/Few  Urinalysis, Complete   Collection Time: 10/14/23 10:56 AM  Result Value Ref Range   Specific Gravity, UA 1.020 1.005 - 1.030   pH, UA 6.0 5.0 - 7.5   Color, UA Yellow Yellow   Appearance Ur Clear Clear   Leukocytes,UA Negative Negative   Protein,UA Negative Negative/Trace   Glucose, UA Negative Negative   Ketones, UA Negative Negative   RBC, UA Negative Negative   Bilirubin, UA Negative Negative   Urobilinogen, Ur 0.2 0.2 - 1.0 mg/dL   Nitrite, UA Negative Negative   Microscopic Examination See below:   Bladder Scan (Post Void Residual) in office   Collection Time: 10/14/23 11:01 AM  Result Value Ref Range   Scan Result 205    Assessment & Plan:   1. Straining to void (Primary) His symptoms have resolved and UA is bland.  I recommended no further intervention at this time, however given that his symptoms were more consistent with prostatitis at onset and he only got 10 days of antibiotics, we discussed that I would like him to reach out to me if his symptoms recur in the next week or so, at which point we will put him on a longer course of antibiotics, 4 to 6 weeks induration, for adequate prostate tissue penetration.  He is in agreement with this plan. - Urinalysis, Complete - Bladder Scan (Post Void Residual) in office  Return for Keep follow-up as scheduled.  Lucie Hones, PA-C  [facility] [place] [address] [place], [place] [address] [phone]

## 2023-12-09 ENCOUNTER — Other Ambulatory Visit: Payer: Self-pay | Admitting: Urology

## 2024-01-13 ENCOUNTER — Ambulatory Visit (INDEPENDENT_AMBULATORY_CARE_PROVIDER_SITE_OTHER): Payer: Self-pay | Admitting: Urology

## 2024-01-13 ENCOUNTER — Encounter: Payer: Self-pay | Admitting: Urology

## 2024-01-13 ENCOUNTER — Other Ambulatory Visit
Admission: RE | Admit: 2024-01-13 | Discharge: 2024-01-13 | Disposition: A | Discharge status: Home / Self Care | Attending: Urology | Admitting: Urology

## 2024-01-13 VITALS — BP 138/89 | Ht 66.0 in | Wt 180.0 lb

## 2024-01-13 DIAGNOSIS — Z87898 Personal history of other specified conditions: Secondary | ICD-10-CM

## 2024-01-13 DIAGNOSIS — Z87440 Personal history of urinary (tract) infections: Secondary | ICD-10-CM

## 2024-01-13 DIAGNOSIS — N419 Inflammatory disease of prostate, unspecified: Secondary | ICD-10-CM | POA: Insufficient documentation

## 2024-01-13 DIAGNOSIS — N401 Enlarged prostate with lower urinary tract symptoms: Secondary | ICD-10-CM

## 2024-01-13 DIAGNOSIS — N138 Other obstructive and reflux uropathy: Secondary | ICD-10-CM

## 2024-01-13 LAB — BLADDER SCAN AMB NON-IMAGING: Scan Result: 182

## 2024-01-13 MED ORDER — TAMSULOSIN HCL 0.4 MG PO CAPS: 0.4000 mg | ORAL_CAPSULE | Freq: Every day | ORAL | 11 refills | Status: AC

## 2024-01-13 NOTE — Progress Notes (Signed)
   01/13/2024 3:04 PM   Dallie Piles Apr 04, 1935 [id number]  Reason for visit: Follow up BPH/LUTS, history of UTI/prostatitis, PSA screening  HPI: 89 year old male who I originally met in February 2020 for with urinary symptoms of frequency, nocturia 2-4 times at night, dysuria, and ultimately was found to have prostatitis and symptoms improved with a 4-week course of Bactrim and starting Flomax.  PSAs have been normal.  He has had some incomplete emptying with PVRs around that improved to ~180ml on Flomax.  He developed similar symptoms in December 2024 and was treated by PCP with 10 days of Cipro for suspected prostatitis/UTI, and called ultimately grew Enterococcus sensitive to Cipro.  He feels his symptoms resolved after that time.  He was seen by Hilton Sinclair for follow-up in February and urinalysis was benign.  Overall, continues to do well.  PVR today stable at on Flomax.  He requested a urine culture to see if any infection is still present.  Would recommend a 4-week course of Cipro if culture positive.  We discussed other options like consideration of outlet procedures to prevent recurrent infections but he would like to hold off at this time, return precautions were discussed  Continue Flomax, RTC 1 year PVR, sooner if recurrent infections and consider evaluation for outlet procedure  Sondra Come, MD  [facility] [address], [address] [place], [place] [address] [phone]

## 2024-01-15 ENCOUNTER — Telehealth: Payer: Self-pay

## 2024-01-15 DIAGNOSIS — N419 Inflammatory disease of prostate, unspecified: Secondary | ICD-10-CM

## 2024-01-15 LAB — URINE CULTURE: Culture: 50000 — AB

## 2024-01-15 MED ORDER — AMOXICILLIN 500 MG PO TABS
500.0000 mg | ORAL_TABLET | Freq: Two times a day (BID) | ORAL | 0 refills | Status: AC
Start: 2024-01-15 — End: 2024-02-14

## 2024-01-15 NOTE — Telephone Encounter (Signed)
 Called pt no answer. LM informing him of the information below. RX sent.

## 2024-01-15 NOTE — Telephone Encounter (Signed)
 Called pt no answer. Left detailed message per DPR. Advised pt to call back for questions or concerns. RX sent.   Please send in amoxicillin 500 mg twice daily x 30 days(60 tabs)

## 2024-03-04 ENCOUNTER — Ambulatory Visit: Payer: Medicare Other | Admitting: Dermatology

## 2024-03-04 ENCOUNTER — Encounter: Payer: Self-pay | Admitting: Dermatology

## 2024-03-04 ENCOUNTER — Ambulatory Visit: Admitting: Dermatology

## 2024-03-04 DIAGNOSIS — D492 Neoplasm of unspecified behavior of bone, soft tissue, and skin: Secondary | ICD-10-CM | POA: Diagnosis not present

## 2024-03-04 DIAGNOSIS — L570 Actinic keratosis: Secondary | ICD-10-CM

## 2024-03-04 DIAGNOSIS — D044 Carcinoma in situ of skin of scalp and neck: Secondary | ICD-10-CM

## 2024-03-04 DIAGNOSIS — Z85828 Personal history of other malignant neoplasm of skin: Secondary | ICD-10-CM | POA: Diagnosis not present

## 2024-03-04 DIAGNOSIS — D485 Neoplasm of uncertain behavior of skin: Secondary | ICD-10-CM

## 2024-03-04 DIAGNOSIS — W908XXA Exposure to other nonionizing radiation, initial encounter: Secondary | ICD-10-CM | POA: Diagnosis not present

## 2024-03-04 DIAGNOSIS — E785 Hyperlipidemia, unspecified: Secondary | ICD-10-CM | POA: Insufficient documentation

## 2024-03-04 DIAGNOSIS — L578 Other skin changes due to chronic exposure to nonionizing radiation: Secondary | ICD-10-CM

## 2024-03-04 NOTE — Progress Notes (Signed)
 Follow-Up Visit   Subjective  Harold Nelson is a 89 y.o. male who presents for the following: 6 month AK follow up. Hx of SCC.   The patient has spots, moles and lesions to be evaluated, some may be new or changing and the patient may have concern these could be cancer.   The following portions of the chart were reviewed this encounter and updated as appropriate: medications, allergies, medical history  Review of Systems:  No other skin or systemic complaints except as noted in HPI or Assessment and Plan.  Objective  Well appearing patient in no apparent distress; mood and affect are within normal limits.   A focused examination was performed of the following areas: Face, scalp, neck  Relevant exam findings are noted in the Assessment and Plan.  right posterior vertex scalp 5 mm pink atrophic scaly papule  R neck x 6, R earlobe x 1, R sup helix x 1, R preauricular x 1, R zygoma x 1, L upper vermillion border x 1, L mid helix x 1, L neck x 3, L preauricular x 2, L jawline x 1, frontal scalp x 4, L post vertex scalp x 1 (23) Erythematous thin papules/macules with gritty scale.   Assessment & Plan     NEOPLASM OF UNCERTAIN BEHAVIOR OF SKIN right posterior vertex scalp Skin / nail biopsy Type of biopsy: tangential   Informed consent: discussed and consent obtained   Timeout: patient name, date of birth, surgical site, and procedure verified   Procedure prep:  Patient was prepped and draped in usual sterile fashion Prep type:  Isopropyl alcohol Anesthesia: the lesion was anesthetized in a standard fashion   Anesthetic:  1% lidocaine  w/ epinephrine  1-100,000 buffered w/ 8.4% NaHCO3 Instrument used: DermaBlade   Hemostasis achieved with: pressure and aluminum chloride   Outcome: patient tolerated procedure well   Post-procedure details: sterile dressing applied and wound care instructions given   Dressing type: bandage and petrolatum   Specimen 1 - Surgical  pathology Differential Diagnosis: r/o SCC  Check Margins: No 5 mm pink atrophic scaly papule AK (ACTINIC KERATOSIS) (23) R neck x 6, R earlobe x 1, R sup helix x 1, R preauricular x 1, R zygoma x 1, L upper vermillion border x 1, L mid helix x 1, L neck x 3, L preauricular x 2, L jawline x 1, frontal scalp x 4, L post vertex scalp x 1 (23) Actinic keratoses are precancerous spots that appear secondary to cumulative UV radiation exposure/sun exposure over time. They are chronic with expected duration over 1 year. A portion of actinic keratoses will progress to squamous cell carcinoma of the skin. It is not possible to reliably predict which spots will progress to skin cancer and so treatment is recommended to prevent development of skin cancer.  Recommend daily broad spectrum sunscreen SPF 30+ to sun-exposed areas, reapply every 2 hours as needed.  Recommend staying in the shade or wearing long sleeves, sun glasses (UVA+UVB protection) and wide brim hats (4-inch brim around the entire circumference of the hat). Call for new or changing lesions. Destruction of lesion - R neck x 6, R earlobe x 1, R sup helix x 1, R preauricular x 1, R zygoma x 1, L upper vermillion border x 1, L mid helix x 1, L neck x 3, L preauricular x 2, L jawline x 1, frontal scalp x 4, L post vertex scalp x 1 (23) Complexity: simple   Destruction method: cryotherapy  Informed consent: discussed and consent obtained   Timeout:  patient name, date of birth, surgical site, and procedure verified Lesion destroyed using liquid nitrogen: Yes   Region frozen until ice ball extended beyond lesion: Yes   Cryo cycles: 1 or 2. Outcome: patient tolerated procedure well with no complications   Post-procedure details: wound care instructions given   ACTINIC ELASTOSIS    Return in about 6 months (around 09/04/2024) for UBSE, AK follow up.  Kerstin Peeling, RMA, am acting as scribe for Harris Liming, MD .   Documentation: I  have reviewed the above documentation for accuracy and completeness, and I agree with the above.  Harris Liming, MD

## 2024-03-04 NOTE — Patient Instructions (Addendum)
 Wound Care Instructions  Cleanse wound gently with soap and water once a day then pat dry with clean gauze. Apply a thin coat of Petrolatum (petroleum jelly, "Vaseline") over the wound (unless you have an allergy to this). We recommend that you use a new, sterile tube of Vaseline. Do not pick or remove scabs. Do not remove the yellow or white "healing tissue" from the base of the wound.  Cover the wound with fresh, clean, nonstick gauze and secure with paper tape. You may use Band-Aids in place of gauze and tape if the wound is small enough, but would recommend trimming much of the tape off as there is often too much. Sometimes Band-Aids can irritate the skin.  You should call the office for your biopsy report after 1 week if you have not already been contacted.  If you experience any problems, such as abnormal amounts of bleeding, swelling, significant bruising, significant pain, or evidence of infection, please call the office immediately.  FOR ADULT SURGERY PATIENTS: If you need something for pain relief you may take 1 extra strength Tylenol (acetaminophen) AND 2 Ibuprofen (200mg  each) together every 4 hours as needed for pain. (do not take these if you are allergic to them or if you have a reason you should not take them.) Typically, you may only need pain medication for 1 to 3 days.   Melanoma ABCDEs  Melanoma is the most dangerous type of skin cancer, and is the leading cause of death from skin disease.  You are more likely to develop melanoma if you: Have light-colored skin, light-colored eyes, or red or blond hair Spend a lot of time in the sun Tan regularly, either outdoors or in a tanning bed Have had blistering sunburns, especially during childhood Have a close family member who has had a melanoma Have atypical moles or large birthmarks  Early detection of melanoma is key since treatment is typically straightforward and cure rates are extremely high if we catch it early.   The  first sign of melanoma is often a change in a mole or a new dark spot.  The ABCDE system is a way of remembering the signs of melanoma.  A for asymmetry:  The two halves do not match. B for border:  The edges of the growth are irregular. C for color:  A mixture of colors are present instead of an even brown color. D for diameter:  Melanomas are usually (but not always) greater than 6mm - the size of a pencil eraser. E for evolution:  The spot keeps changing in size, shape, and color.  Please check your skin once per month between visits. You can use a small mirror in front and a large mirror behind you to keep an eye on the back side or your body.   If you see any new or changing lesions before your next follow-up, please call to schedule a visit.  Please continue daily skin protection including broad spectrum sunscreen SPF 30+ to sun-exposed areas, reapplying every 2 hours as needed when you're outdoors.    Due to recent changes in healthcare laws, you may see results of your pathology and/or laboratory studies on MyChart before the doctors have had a chance to review them. We understand that in some cases there may be results that are confusing or concerning to you. Please understand that not all results are received at the same time and often the doctors may need to interpret multiple results in order to provide you  with the best plan of care or course of treatment. Therefore, we ask that you please give [place] 2 business days to thoroughly review all your results before contacting the office for clarification. Should we see a critical lab result, you will be contacted sooner.   If You Need Anything After Your Visit  If you have any questions or concerns for your doctor, please call our main line at [phone] and press option 4 to reach your doctor's medical assistant. If no one answers, please leave a voicemail as directed and we will return your call as soon as possible. Messages left after 4  pm will be answered the following business day.   You may also send [place] a message via MyChart. We typically respond to MyChart messages within 1-2 business days.  For prescription refills, please ask your pharmacy to contact our office. Our fax number is [phone].  If you have an urgent issue when the clinic is closed that cannot wait until the next business day, you can page your doctor at the number below.    Please note that while we do our best to be available for urgent issues outside of office hours, we are not available 24/7.   If you have an urgent issue and are unable to reach [place], you may choose to seek medical care at your doctor's office, retail clinic, urgent care center, or emergency room.  If you have a medical emergency, please immediately call 911 or go to the emergency department.  Pager Numbers  - Dr. Gwen Pounds: [phone]  - Dr. Roseanne Reno: [phone]  - Dr. Katrinka Blazing: [phone]   In the event of inclement weather, please call our main line at [phone] for an update on the status of any delays or closures.  Dermatology Medication Tips: Please keep the boxes that topical medications come in in order to help keep track of the instructions about where and how to use these. Pharmacies typically print the medication instructions only on the boxes and not directly on the medication tubes.   If your medication is too expensive, please contact our office at [phone] option 4 or send [place] a message through MyChart.   We are unable to tell what your co-pay for medications will be in advance as this is different depending on your insurance coverage. However, we may be able to find a substitute medication at lower cost or fill out paperwork to get insurance to cover a needed medication.   If a prior authorization is required to get your medication covered by your insurance company, please allow [place] 1-2 business days to complete this process.  Drug prices often vary  depending on where the prescription is filled and some pharmacies may offer cheaper prices.  The website www.goodrx.com contains coupons for medications through different pharmacies. The prices here do not account for what the cost may be with help from insurance (it may be cheaper with your insurance), but the website can give you the price if you did not use any insurance.  - You can print the associated coupon and take it with your prescription to the pharmacy.  - You may also stop by our office during regular business hours and pick up a GoodRx coupon card.  - If you need your prescription sent electronically to a different pharmacy, notify our office through [facility] or by phone at [phone] option 4.     Si Usted Necesita Algo Despus de Su Visita  Tambin puede enviarnos un mensaje a  travs de MyChart. Por lo general respondemos a los mensajes de MyChart en el transcurso de 1 a 2 das hbiles.  Para renovar recetas, por favor pida a su farmacia que se ponga en contacto con nuestra oficina. Annie Sable de fax es [place] [phone].  Si tiene un asunto urgente cuando la clnica est cerrada y que no puede esperar hasta el siguiente da hbil, puede llamar/localizar a su doctor(a) al nmero que aparece a continuacin.   Por favor, tenga en cuenta que aunque hacemos todo lo posible para estar disponibles para asuntos urgentes fuera del horario de Edie, no estamos disponibles las 24 horas del da, los 7 [address]  [address] de la [place].   Si tiene un problema urgente y no puede comunicarse con nosotros, puede optar por buscar atencin mdica  en el consultorio de su doctor(a), en una clnica privada, en un centro de atencin urgente o en una sala de emergencias.  Si tiene Engineer, drilling, por favor llame inmediatamente al 911 o vaya a la sala de emergencias.  Nmeros de bper  - Dr. Gwen Pounds: [phone]  - Dra. Roseanne Reno: [phone]  - Dr. Katrinka Blazing: [phone]   En caso de  inclemencias del tiempo, por favor llame a Lacy Duverney principal al [phone] para una actualizacin sobre el [place] de cualquier retraso o cierre.  Consejos para la medicacin en dermatologa: Por favor, guarde las cajas en las que vienen los medicamentos de uso tpico para ayudarle a seguir las instrucciones sobre dnde y cmo usarlos. Las farmacias generalmente imprimen las instrucciones del medicamento slo en las cajas y no directamente en los tubos del [place].   Si su medicamento es muy caro, por favor, pngase en contacto con Rolm Gala llamando al [phone] y presione la opcin 4 o envenos un mensaje a travs de Clinical cytogeneticist.   No podemos decirle cul ser su copago por los medicamentos por adelantado ya que esto es diferente dependiendo de la cobertura de su seguro. Sin embargo, es posible que podamos encontrar un medicamento sustituto a Audiological scientist un formulario para que el seguro cubra el medicamento que se considera necesario.   Si se requiere una autorizacin previa para que su compaa de seguros [place] su medicamento, por favor permtanos de 1 a 2 das hbiles para completar [address]  Los precios de los medicamentos varan con frecuencia dependiendo del Environmental consultant de dnde se surte la receta y alguna farmacias pueden ofrecer precios ms baratos.  El sitio web www.goodrx.com tiene cupones para medicamentos de Health and safety inspector. Los precios aqu no tienen en cuenta lo que podra costar con la ayuda del seguro (puede ser ms barato con su seguro), pero el sitio web puede darle el precio si no utiliz Tourist information centre manager.  - Puede imprimir el cupn correspondiente y llevarlo con su receta a la farmacia.  - Tambin puede pasar por nuestra oficina durante el horario de atencin regular y Education officer, museum una tarjeta de cupones de GoodRx.  - Si necesita que su receta se enve electrnicamente a una farmacia diferente, informe a nuestra oficina a travs de MyChart de [place] o  por telfono llamando al [phone] y presione la opcin 4.

## 2024-03-10 ENCOUNTER — Ambulatory Visit: Payer: Self-pay | Admitting: Dermatology

## 2024-03-10 DIAGNOSIS — D099 Carcinoma in situ, unspecified: Secondary | ICD-10-CM

## 2024-03-10 LAB — SURGICAL PATHOLOGY

## 2024-03-11 ENCOUNTER — Encounter: Payer: Self-pay | Admitting: Dermatology

## 2024-03-11 NOTE — Telephone Encounter (Signed)
-----   Message from [place] sent at 03/10/2024  5:34 PM EDT ----- Diagnosis right posterior vertex scalp :       SQUAMOUS CELL CARCINOMA IN SITU, BASE INVOLVED     Please call with diagnosis and message me with patient's decision on treatment.   Explanation: Biopsy shows a squamous cell skin cancer limited to the top layer of skin. This means it is an early cancer and has not spread. However, it has the potential to spread beyond the skin and threaten your health, so we recommend treating it.   Treatment option 1: a cream (fluorouracil and calcipotriene) that helps your immune system clear the skin cancer. It will cause redness and irritation. Wait two weeks after the biopsy to start applying the cream. Apply the cream twice per day until the redness and irritation develop (usually occurs by day 7), then stop and allow it to heal. We will recheck the area in 2 months to ensure the cancer is gone. The cream is $45 plus shipping and will be mailed to you from a low cost compounding pharmacy.  Treatment option 2: you return for a brief appointment where I perform electrodesiccation and curettage [facility]). This involves three rounds of scraping and burning to destroy the skin cancer. It has about an 85% cure rate and leaves a round wound slightly larger than the skin cancer and leaves a round white scar. No additional pathology is done. If the skin cancer comes back, we would need to do a surgery to remove it.  Treatment option 3: Mohs surgery, which involves cutting out right around the skin cancer and then checking under the microscope on the same day to ensure the whole skin cancer is out. If there is more cancer remaining, the surgeon will repeat the process until it is fully cleared. The cure rate is about 98-99%. It is done at another office outside of [place] ([place], [place], or [place]). Once the Mohs surgeon confirms the skin cancer is out, they will discuss the options to repair  or heal the area. You must take it easy for about two weeks after surgery (no lifting over 10-15 lbs, avoid activity to get your heart rate and blood pressure up).

## 2024-03-11 NOTE — Telephone Encounter (Signed)
 Left pt msg to call for bx results/sh

## 2024-03-15 ENCOUNTER — Encounter: Payer: Self-pay | Admitting: Dermatology

## 2024-03-15 NOTE — Telephone Encounter (Signed)
 Patient came into office to review treatment options for bx proven SCCis at scalp. Patient prefers Mohs with Dr. Debrah Fan. Referral sent. Sue Em., RMA

## 2024-03-15 NOTE — Addendum Note (Signed)
 Addended by: Sarabella Caprio I on: 03/15/2024 09:24 AM   Modules accepted: Orders

## 2024-03-15 NOTE — Telephone Encounter (Signed)
-----   Message from [place] sent at 03/10/2024  5:34 PM EDT ----- Diagnosis right posterior vertex scalp :       SQUAMOUS CELL CARCINOMA IN SITU, BASE INVOLVED     Please call with diagnosis and message me with patient's decision on treatment.   Explanation: Biopsy shows a squamous cell skin cancer limited to the top layer of skin. This means it is an early cancer and has not spread. However, it has the potential to spread beyond the skin and threaten your health, so we recommend treating it.   Treatment option 1: a cream (fluorouracil and calcipotriene) that helps your immune system clear the skin cancer. It will cause redness and irritation. Wait two weeks after the biopsy to start applying the cream. Apply the cream twice per day until the redness and irritation develop (usually occurs by day 7), then stop and allow it to heal. We will recheck the area in 2 months to ensure the cancer is gone. The cream is $45 plus shipping and will be mailed to you from a low cost compounding pharmacy.  Treatment option 2: you return for a brief appointment where I perform electrodesiccation and curettage [facility]). This involves three rounds of scraping and burning to destroy the skin cancer. It has about an 85% cure rate and leaves a round wound slightly larger than the skin cancer and leaves a round white scar. No additional pathology is done. If the skin cancer comes back, we would need to do a surgery to remove it.  Treatment option 3: Mohs surgery, which involves cutting out right around the skin cancer and then checking under the microscope on the same day to ensure the whole skin cancer is out. If there is more cancer remaining, the surgeon will repeat the process until it is fully cleared. The cure rate is about 98-99%. It is done at another office outside of [place] ([place], [place], or [place]). Once the Mohs surgeon confirms the skin cancer is out, they will discuss the options to repair  or heal the area. You must take it easy for about two weeks after surgery (no lifting over 10-15 lbs, avoid activity to get your heart rate and blood pressure up).

## 2024-03-22 ENCOUNTER — Encounter: Payer: Self-pay | Admitting: Dermatology

## 2024-04-12 ENCOUNTER — Ambulatory Visit: Admitting: Dermatology

## 2024-04-20 ENCOUNTER — Encounter: Payer: Self-pay | Admitting: Dermatology

## 2024-04-20 ENCOUNTER — Ambulatory Visit: Admitting: Dermatology

## 2024-04-20 DIAGNOSIS — D044 Carcinoma in situ of skin of scalp and neck: Secondary | ICD-10-CM | POA: Diagnosis not present

## 2024-04-20 DIAGNOSIS — T148XXA Other injury of unspecified body region, initial encounter: Secondary | ICD-10-CM

## 2024-04-20 DIAGNOSIS — S0001XA Abrasion of scalp, initial encounter: Secondary | ICD-10-CM

## 2024-04-20 DIAGNOSIS — D099 Carcinoma in situ, unspecified: Secondary | ICD-10-CM

## 2024-04-20 NOTE — Progress Notes (Signed)
   Follow-Up Visit   Subjective  Harold Nelson is a 89 y.o. male who presents for the following: Bx proven SCCIS of the R post vertex scalp, pt would like to discuss treatment options again, because lesion is healing so well he wants to make sure he needs Mohs.  The following portions of the chart were reviewed this encounter and updated as appropriate: medications, allergies, medical history  Review of Systems:  No other skin or systemic complaints except as noted in HPI or Assessment and Plan.  Objective  Well appearing patient in no apparent distress; mood and affect are within normal limits.   A focused examination was performed of the following areas: the face and scalp   Relevant exam findings are noted in the Assessment and Plan.  R post vertex scalp Clear today. Well-healed pink macule (scar)  Assessment & Plan  SQUAMOUS CELL CARCINOMA IN SITU R post vertex scalp Lesion appears to be clear today. Will continue to monitor. Pt to cancel Mohs procedure.   EXCORIATION    excoriation R frontal scalp Ulcerated pink papule  Ddx excoriation > AK/SCC Plan: Recheck at follow up. Continue Vaseline daily. Cover with bandage  Return for appointment as scheduled.  LILLETTE Harold Nelson, CMA, am acting as scribe for Boneta Sharps, MD .   Documentation: I have reviewed the above documentation for accuracy and completeness, and I agree with the above.  Boneta Sharps, MD

## 2024-04-20 NOTE — Patient Instructions (Signed)

## 2024-04-27 ENCOUNTER — Other Ambulatory Visit: Payer: Self-pay | Admitting: Student

## 2024-04-27 DIAGNOSIS — M7582 Other shoulder lesions, left shoulder: Secondary | ICD-10-CM

## 2024-04-27 DIAGNOSIS — M7542 Impingement syndrome of left shoulder: Secondary | ICD-10-CM

## 2024-04-27 DIAGNOSIS — M19012 Primary osteoarthritis, left shoulder: Secondary | ICD-10-CM

## 2024-04-28 ENCOUNTER — Ambulatory Visit
Admission: RE | Admit: 2024-04-28 | Discharge: 2024-04-28 | Disposition: A | Discharge status: Home / Self Care | Source: Ambulatory Visit | Attending: Student | Admitting: Student

## 2024-04-28 DIAGNOSIS — M19012 Primary osteoarthritis, left shoulder: Secondary | ICD-10-CM | POA: Diagnosis present

## 2024-04-28 DIAGNOSIS — M7542 Impingement syndrome of left shoulder: Secondary | ICD-10-CM | POA: Diagnosis present

## 2024-04-28 DIAGNOSIS — M7582 Other shoulder lesions, left shoulder: Secondary | ICD-10-CM | POA: Diagnosis present

## 2024-05-10 DIAGNOSIS — M7582 Other shoulder lesions, left shoulder: Secondary | ICD-10-CM | POA: Insufficient documentation

## 2024-05-10 DIAGNOSIS — M19012 Primary osteoarthritis, left shoulder: Secondary | ICD-10-CM | POA: Insufficient documentation

## 2024-05-12 ENCOUNTER — Other Ambulatory Visit: Payer: Self-pay | Admitting: Surgery

## 2024-05-12 DIAGNOSIS — M19012 Primary osteoarthritis, left shoulder: Secondary | ICD-10-CM

## 2024-05-12 DIAGNOSIS — M7582 Other shoulder lesions, left shoulder: Secondary | ICD-10-CM

## 2024-05-20 ENCOUNTER — Ambulatory Visit
Admission: RE | Admit: 2024-05-20 | Discharge: 2024-05-20 | Disposition: A | Discharge status: Home / Self Care | Source: Ambulatory Visit | Attending: Surgery | Admitting: Surgery

## 2024-05-20 DIAGNOSIS — M19012 Primary osteoarthritis, left shoulder: Secondary | ICD-10-CM | POA: Diagnosis present

## 2024-05-20 DIAGNOSIS — M7582 Other shoulder lesions, left shoulder: Secondary | ICD-10-CM | POA: Insufficient documentation

## 2024-06-02 ENCOUNTER — Other Ambulatory Visit: Payer: Self-pay | Admitting: Surgery

## 2024-06-03 ENCOUNTER — Encounter: Payer: Self-pay | Admitting: Urology

## 2024-06-08 ENCOUNTER — Ambulatory Visit (INDEPENDENT_AMBULATORY_CARE_PROVIDER_SITE_OTHER): Admitting: Dermatology

## 2024-06-08 ENCOUNTER — Encounter: Payer: Self-pay | Admitting: Dermatology

## 2024-06-08 DIAGNOSIS — W908XXA Exposure to other nonionizing radiation, initial encounter: Secondary | ICD-10-CM | POA: Diagnosis not present

## 2024-06-08 DIAGNOSIS — L570 Actinic keratosis: Secondary | ICD-10-CM

## 2024-06-08 DIAGNOSIS — Z86007 Personal history of in-situ neoplasm of skin: Secondary | ICD-10-CM | POA: Diagnosis not present

## 2024-06-08 DIAGNOSIS — L821 Other seborrheic keratosis: Secondary | ICD-10-CM | POA: Diagnosis not present

## 2024-06-08 DIAGNOSIS — L820 Inflamed seborrheic keratosis: Secondary | ICD-10-CM | POA: Diagnosis not present

## 2024-06-08 NOTE — Progress Notes (Signed)
   Follow-Up Visit   Subjective  Harold Nelson is a 89 y.o. male who presents for the following: Irregular/changing skin lesions on the forehead and chest, pt scheduled for L shoulder repair next week.  The following portions of the chart were reviewed this encounter and updated as appropriate: medications, allergies, medical history  Review of Systems:  No other skin or systemic complaints except as noted in HPI or Assessment and Plan.  Objective  Well appearing patient in no apparent distress; mood and affect are within normal limits.   A focused examination was performed of the following areas: the face, scalp, arms, and chest   Relevant exam findings are noted in the Assessment and Plan.  Chest x 1 Erythematous keratotic or waxy stuck-on papule or plaque. L forehead x 1 Erythematous thin papules/macules with gritty scale.   Assessment & Plan   SEBORRHEIC KERATOSIS - Stuck-on, waxy, tan-brown papules and/or plaques  - Benign-appearing - Discussed benign etiology and prognosis. - Observe - Call for any changes  INFLAMED SEBORRHEIC KERATOSIS Chest x 1 Symptomatic, irritating, patient would like treated. Destruction of lesion - Chest x 1 Complexity: simple   Destruction method: cryotherapy   Informed consent: discussed and consent obtained   Timeout:  patient name, date of birth, surgical site, and procedure verified Lesion destroyed using liquid nitrogen: Yes   Region frozen until ice ball extended beyond lesion: Yes   Cryo cycles: 1 or 2. Outcome: patient tolerated procedure well with no complications   Post-procedure details: wound care instructions given    AK (ACTINIC KERATOSIS) L forehead x 1 Actinic keratoses are precancerous spots that appear secondary to cumulative UV radiation exposure/sun exposure over time. They are chronic with expected duration over 1 year. A portion of actinic keratoses will progress to squamous cell carcinoma of the skin. It is not  possible to reliably predict which spots will progress to skin cancer and so treatment is recommended to prevent development of skin cancer.  Recommend daily broad spectrum sunscreen SPF 30+ to sun-exposed areas, reapply every 2 hours as needed.  Recommend staying in the shade or wearing long sleeves, sun glasses (UVA+UVB protection) and wide brim hats (4-inch brim around the entire circumference of the hat). Call for new or changing lesions. Destruction of lesion - L forehead x 1 Complexity: simple   Destruction method: cryotherapy   Informed consent: discussed and consent obtained   Timeout:  patient name, date of birth, surgical site, and procedure verified Lesion destroyed using liquid nitrogen: Yes   Region frozen until ice ball extended beyond lesion: Yes   Cryo cycles: 1 or 2. Outcome: patient tolerated procedure well with no complications   Post-procedure details: wound care instructions given     HISTORY OF SQUAMOUS CELL CARCINOMA IN SITU OF THE SKIN - R post vertex scalp - No evidence of recurrence today - Recommend regular full body skin exams - Recommend daily broad spectrum sunscreen SPF 30+ to sun-exposed areas, reapply every 2 hours as needed.  - Call if any new or changing lesions are noted between office visits  Return for appointment as scheduled.  LILLETTE [name], CMA, am acting as scribe for Boneta Sharps, MD .  Documentation: I have reviewed the above documentation for accuracy and completeness, and I agree with the above.  Boneta Sharps, MD

## 2024-06-08 NOTE — Patient Instructions (Signed)

## 2024-06-10 ENCOUNTER — Other Ambulatory Visit: Payer: Self-pay

## 2024-06-10 ENCOUNTER — Encounter
Admission: RE | Admit: 2024-06-10 | Discharge: 2024-06-10 | Disposition: A | Discharge status: Home / Self Care | Source: Ambulatory Visit | Attending: Surgery | Admitting: Surgery

## 2024-06-10 VITALS — BP 120/83 | HR 69 | Resp 16 | Ht 65.0 in | Wt 174.0 lb

## 2024-06-10 DIAGNOSIS — R8281 Pyuria: Secondary | ICD-10-CM | POA: Insufficient documentation

## 2024-06-10 DIAGNOSIS — Z01818 Encounter for other preprocedural examination: Secondary | ICD-10-CM | POA: Diagnosis present

## 2024-06-10 DIAGNOSIS — R8271 Bacteriuria: Secondary | ICD-10-CM | POA: Diagnosis not present

## 2024-06-10 DIAGNOSIS — M19012 Primary osteoarthritis, left shoulder: Secondary | ICD-10-CM

## 2024-06-10 DIAGNOSIS — R8290 Unspecified abnormal findings in urine: Secondary | ICD-10-CM | POA: Insufficient documentation

## 2024-06-10 DIAGNOSIS — Z01812 Encounter for preprocedural laboratory examination: Secondary | ICD-10-CM

## 2024-06-10 HISTORY — DX: Gastro-esophageal reflux disease without esophagitis: K21.9

## 2024-06-10 LAB — URINALYSIS, ROUTINE W REFLEX MICROSCOPIC
Bilirubin Urine: NEGATIVE
Glucose, UA: NEGATIVE mg/dL
Hgb urine dipstick: NEGATIVE
Ketones, ur: NEGATIVE mg/dL
Nitrite: NEGATIVE
Protein, ur: NEGATIVE mg/dL
Specific Gravity, Urine: 1.01 (ref 1.005–1.030)
Squamous Epithelial / HPF: 0 /HPF (ref 0–5)
pH: 6 (ref 5.0–8.0)

## 2024-06-10 LAB — COMPREHENSIVE METABOLIC PANEL WITH GFR
ALT: 16 U/L (ref 0–44)
AST: 22 U/L (ref 15–41)
Albumin: 3.5 g/dL (ref 3.5–5.0)
Alkaline Phosphatase: 60 U/L (ref 38–126)
Anion gap: 8 (ref 5–15)
BUN: 16 mg/dL (ref 8–23)
CO2: 25 mmol/L (ref 22–32)
Calcium: 8.9 mg/dL (ref 8.9–10.3)
Chloride: 103 mmol/L (ref 98–111)
Creatinine, Ser: 1.16 mg/dL (ref 0.61–1.24)
GFR, Estimated: >60 mL/min (ref >60)
Glucose, Bld: 102 mg/dL — ABNORMAL HIGH (ref 70–99)
Potassium: 4 mmol/L (ref 3.5–5.1)
Sodium: 136 mmol/L (ref 135–145)
Total Bilirubin: 0.6 mg/dL (ref 0.0–1.2)
Total Protein: 6.7 g/dL (ref 6.5–8.1)

## 2024-06-10 LAB — CBC WITH DIFFERENTIAL/PLATELET
Abs Immature Granulocytes: 0.01 K/uL (ref 0.00–0.07)
Basophils Absolute: 0 K/uL (ref 0.0–0.1)
Basophils Relative: 1 %
Eosinophils Absolute: 0.3 K/uL (ref 0.0–0.5)
Eosinophils Relative: 5 %
HCT: 40.8 % (ref 39.0–52.0)
Hemoglobin: 14 g/dL (ref 13.0–17.0)
Immature Granulocytes: 0 %
Lymphocytes Relative: 41 %
Lymphs Abs: 2.7 K/uL (ref 0.7–4.0)
MCH: 30.2 pg (ref 26.0–34.0)
MCHC: 34.3 g/dL (ref 30.0–36.0)
MCV: 87.9 fL (ref 80.0–100.0)
Monocytes Absolute: 0.5 K/uL (ref 0.1–1.0)
Monocytes Relative: 8 %
Neutro Abs: 3 K/uL (ref 1.7–7.7)
Neutrophils Relative %: 45 %
Platelets: 232 K/uL (ref 150–400)
RBC: 4.64 MIL/uL (ref 4.22–5.81)
RDW: 13.4 % (ref 11.5–15.5)
WBC: 6.6 K/uL (ref 4.0–10.5)
nRBC: 0 % (ref 0.0–0.2)

## 2024-06-10 LAB — SURGICAL PCR SCREEN
MRSA, PCR: NEGATIVE
Staphylococcus aureus: NEGATIVE

## 2024-06-10 NOTE — Patient Instructions (Addendum)
 Your procedure is scheduled on: Thursday 06/17/24 Report to the Registration Desk on the 1st floor of the [facility]. To find out your arrival time, please call [phone] between 1PM - 3PM on: Wednesday 06/16/24 If your arrival time is 6:00 am, do not arrive before that time as the [facility] entrance doors do not open until 6:00 am.  REMEMBER: Instructions that are not followed completely may result in serious medical risk, up to and including death; or upon the discretion of your surgeon and anesthesiologist your surgery may need to be rescheduled.  Do not eat food after midnight the night before surgery.  No gum chewing or hard candies.  You may however, drink CLEAR liquids up to 2 hours before you are scheduled to arrive for your surgery. Do not drink anything within 2 hours of your scheduled arrival time.  Clear liquids include: - water  - apple juice without pulp - gatorade (not RED colors) - black coffee or tea (Do NOT add milk or creamers to the coffee or tea) Do NOT drink anything that is not on this list.  In addition, your doctor has ordered for you to drink the provided:  Ensure Pre-Surgery Clear Carbohydrate Drink  Drinking this carbohydrate drink up to two hours before surgery helps to reduce insulin resistance and improve patient outcomes. Please complete drinking 2 hours before scheduled arrival time.  One week prior to surgery: Stop Anti-inflammatories (NSAIDS) such as Advil, Aleve, Ibuprofen, Motrin, Naproxen, Naprosyn and Aspirin  based products such as Excedrin, Goody's Powder, BC Powder. Stop your Meloxicam today until after surgery.  You may however, continue to take Tylenol  if needed for pain up until the day of surgery.  Stop ANY OVER THE COUNTER supplements and vitamins TODAY until after surgery.  Continue taking all of your other prescription medications up until the day of surgery.  ON THE DAY OF SURGERY ONLY TAKE THESE MEDICATIONS WITH SIPS OF  WATER:  esomeprazole (NEXIUM) 40 MG capsule  tamsulosin  (FLOMAX ) 0.4 MG CAPS capsule   No Alcohol for 24 hours before or after surgery.  No Smoking including e-cigarettes for 24 hours before surgery.  No chewable tobacco products for at least 6 hours before surgery.  No nicotine patches on the day of surgery.  Do not use any recreational drugs for at least a week (preferably 2 weeks) before your surgery.  Please be advised that the combination of cocaine and anesthesia may have negative outcomes, up to and including death. If you test positive for cocaine, your surgery will be cancelled.  On the morning of surgery brush your teeth with toothpaste and water, you may rinse your mouth with mouthwash if you wish. Do not swallow any toothpaste or mouthwash.  Use CHG Soap or wipes as directed on instruction sheet.  Do not wear lotions, powders, or perfumes on the day of surgery.  Do not shave body hair from the neck down 48 hours before surgery.  Wear comfortable clothing (specific to your surgery type) to the hospital.  Do not wear jewelry, make-up, hairpins, clips or nail polish.  For welded (permanent) jewelry: bracelets, anklets, waist bands, etc.  Please have this removed prior to surgery.  If it is not removed, there is a chance that hospital personnel will need to cut it off on the day of surgery.  Contact lenses, hearing aids and dentures may not be worn into surgery. Bring a case for your glasses  Do not bring valuables to the hospital. [facility]  is not responsible for any missing/lost belongings or valuables.   Notify your doctor if there is any change in your medical condition (cold, fever, infection). Covid symptoms  After surgery, you can help prevent lung complications by doing breathing exercises.  Take deep breaths and cough every 1-2 hours. Your doctor may order a device called an Incentive Spirometer to help you take deep breaths.  If you are being discharged  the day of surgery, you will not be allowed to drive home. You will need a responsible individual to drive you home and stay with you for 24 hours after surgery.   Please call the Pre-admissions Testing Dept. at [phone] if you have any questions about these instructions.  Surgery Visitation Policy:  Patients having surgery or a procedure may have two visitors.  Children under the age of 2 must have an adult with them who is not the patient.   Merchandiser, retail to address health-related social needs:  [email or web address]    Pre-operative 5 CHG Bath Instructions   You can play a key role in reducing the risk of infection after surgery. Your skin needs to be as free of germs as possible. You can reduce the number of germs on your skin by washing with CHG (chlorhexidine  gluconate) soap before surgery. CHG is an antiseptic soap that kills germs and continues to kill germs even after washing.   DO NOT use if you have an allergy to chlorhexidine /CHG or antibacterial soaps. If your skin becomes reddened or irritated, stop using the CHG and notify one of our RNs at [phone].   Please shower with the CHG soap starting 4 days before surgery using the following schedule:   Sunday 06/13/24 - Thursday 06/17/24    Please keep in mind the following:  DO NOT shave, including legs and underarms, starting the day of your first shower.   You may shave your face at any point before/day of surgery.  Place clean sheets on your bed the day you start using CHG soap. Use a clean washcloth (not used since being washed) for each shower. DO NOT sleep with pets once you start using the CHG.   CHG Shower Instructions:  If you choose to wash your hair and private area, wash first with your normal shampoo/soap.  After you use shampoo/soap, rinse your hair and body thoroughly to remove shampoo/soap residue.  Turn the water OFF and apply about 3 tablespoons (45 ml) of CHG soap to a  CLEAN washcloth.  Apply CHG soap ONLY FROM YOUR NECK DOWN TO YOUR TOES (washing for 3-5 minutes)  DO NOT use CHG soap on face, private areas, open wounds, or sores.  Pay special attention to the area where your surgery is being performed.  If you are having back surgery, having someone wash your back for you may be helpful. Wait 2 minutes after CHG soap is applied, then you may rinse off the CHG soap.  Pat dry with a clean towel  Put on clean clothes/pajamas   If you choose to wear lotion, please use ONLY the CHG-compatible lotions on the back of this paper.     Additional instructions for the day of surgery: DO NOT APPLY any lotions, deodorants, cologne, or perfumes.   Put on clean/comfortable clothes.  Brush your teeth.  Ask your nurse before applying any prescription medications to the skin.      CHG Compatible Lotions   Aveeno Moisturizing lotion  Cetaphil Moisturizing Cream  Cetaphil Moisturizing Lotion  Clairol  Herbal Essence Moisturizing Lotion, Dry Skin  Clairol Herbal Essence Moisturizing Lotion, Extra Dry Skin  Clairol Herbal Essence Moisturizing Lotion, Normal Skin  Curel Age Defying Therapeutic Moisturizing Lotion with Alpha Hydroxy  Curel Extreme Care Body Lotion  Curel Soothing Hands Moisturizing Hand Lotion  Curel Therapeutic Moisturizing Cream, Fragrance-Free  Curel Therapeutic Moisturizing Lotion, Fragrance-Free  Curel Therapeutic Moisturizing Lotion, Original Formula  Eucerin Daily Replenishing Lotion  Eucerin Dry Skin Therapy Plus Alpha Hydroxy Crme  Eucerin Dry Skin Therapy Plus Alpha Hydroxy Lotion  Eucerin Original Crme  Eucerin Original Lotion  Eucerin Plus Crme Eucerin Plus Lotion  Eucerin TriLipid Replenishing Lotion  Keri Anti-Bacterial Hand Lotion  Keri Deep Conditioning Original Lotion Dry Skin Formula Softly Scented  Keri Deep Conditioning Original Lotion, Fragrance Free Sensitive Skin Formula  Keri Lotion Fast Absorbing Fragrance Free  Sensitive Skin Formula  Keri Lotion Fast Absorbing Softly Scented Dry Skin Formula  Keri Original Lotion  Keri Skin Renewal Lotion Keri Silky Smooth Lotion  Keri Silky Smooth Sensitive Skin Lotion  Nivea Body Creamy Conditioning Oil  Nivea Body Extra Enriched Lotion  Nivea Body Original Lotion  Nivea Body Sheer Moisturizing Lotion Nivea Crme  Nivea Skin Firming Lotion  NutraDerm 30 Skin Lotion  NutraDerm Skin Lotion  NutraDerm Therapeutic Skin Cream  NutraDerm Therapeutic Skin Lotion  ProShield Protective Hand Cream  Provon moisturizing lotion  Preparing for Total Shoulder Arthroplasty  Before surgery, you can play an important role by reducing the number of germs on your skin by using the following products:  Benzoyl Peroxide Gel  o Reduces the number of germs present on the skin  o Applied twice a day to shoulder area starting two days before surgery  Chlorhexidine  Gluconate (CHG) Soap  o An antiseptic cleaner that kills germs and bonds with the skin to continue killing germs even after washing  o Used for showering the night before surgery and morning of surgery  BENZOYL PEROXIDE 5% GEL                               Please do not use if you have an allergy to benzoyl peroxide. If your skin becomes reddened/irritated stop using the benzoyl peroxide.  Starting two days before surgery, apply as follows:  1. Apply benzoyl peroxide in the morning and at night. Apply after taking a shower. If you are not taking a shower, clean entire shoulder front, back, and side along with the armpit with a clean wet washcloth.  2. Place a quarter-sized dollop on your shoulder and rub in thoroughly, making sure to cover the front, back, and side of your shoulder, along with the armpit.  2 days before ____ AM ____ PM (Tuesday 06/15/24)   1 day before ____ AM ____ PM (Wednesday 06/16/24)  3. Do this twice a day for two days. (Last application is the night before surgery, AFTER using the CHG  soap).  4. Do NOT apply benzoyl peroxide gel on the day of surgery.  How to Use an Incentive Spirometer  An incentive spirometer is a tool that measures how well you are filling your lungs with each breath. Learning to take long, deep breaths using this tool can help you keep your lungs clear and active. This may help to reverse or lessen your chance of developing breathing (pulmonary) problems, especially infection. You may be asked to use a spirometer: After a surgery. If you have a lung problem or a  history of smoking. After a long period of time when you have been unable to move or be active. If the spirometer includes an indicator to show the highest number that you have reached, your health care provider or respiratory therapist will help you set a goal. Keep a log of your progress as told by your health care provider. What are the risks? Breathing too quickly may cause dizziness or cause you to pass out. Take your time so you do not get dizzy or light-headed. If you are in pain, you may need to take pain medicine before doing incentive spirometry. It is harder to take a deep breath if you are having pain.  How to use your incentive spirometer  Sit up on the edge of your bed or on a chair. Hold the incentive spirometer so that it is in an upright position. Before you use the spirometer, breathe out normally. Place the mouthpiece in your mouth. Make sure your lips are closed tightly around it. Breathe in slowly and as deeply as you can through your mouth, causing the piston or the ball to rise toward the top of the chamber. Hold your breath for 3-5 seconds, or for as long as possible. If the spirometer includes a coach indicator, use this to guide you in breathing. Slow down your breathing if the indicator goes above the marked areas. Remove the mouthpiece from your mouth and breathe out normally. The piston or ball will return to the bottom of the chamber. Rest for a few seconds, then  repeat the steps 10 or more times. Take your time and take a few normal breaths between deep breaths so that you do not get dizzy or light-headed. Do this every 1-2 hours when you are awake. If the spirometer includes a goal marker to show the highest number you have reached (best effort), use this as a goal to work toward during each repetition. After each set of 10 deep breaths, cough a few times. This will help to make sure that your lungs are clear. If you have an incision on your chest or abdomen from surgery, place a pillow or a rolled-up towel firmly against the incision when you cough. This can help to reduce pain while taking deep breaths and coughing. General tips When you are able to get out of bed: Walk around often. Continue to take deep breaths and cough in order to clear your lungs. Keep using the incentive spirometer until your health care provider says it is okay to stop using it. If you have been in the hospital, you may be told to keep using the spirometer at home. Contact a health care provider if: You are having difficulty using the spirometer. You have trouble using the spirometer as often as instructed. Your pain medicine is not giving enough relief for you to use the spirometer as told. You have a fever. Get help right away if: You develop shortness of breath. You develop a cough with bloody mucus from the lungs. You have fluid or blood coming from an incision site after you cough. Summary An incentive spirometer is a tool that can help you learn to take long, deep breaths to keep your lungs clear and active. You may be asked to use a spirometer after a surgery, if you have a lung problem or a history of smoking, or if you have been inactive for a long period of time. Use your incentive spirometer as instructed every 1-2 hours while you are awake. If  you have an incision on your chest or abdomen, place a pillow or a rolled-up towel firmly against your incision when you  cough. This will help to reduce pain. Get help right away if you have shortness of breath, you cough up bloody mucus, or blood comes from your incision when you cough. This information is not intended to replace advice given to you by your health care provider. Make sure you discuss any questions you have with your health care provider. Document Revised: 12/13/2019 Document Reviewed: 12/13/2019 Elsevier Patient Education  2023 [employer].

## 2024-06-11 ENCOUNTER — Ambulatory Visit: Payer: Self-pay | Admitting: Urgent Care

## 2024-06-11 LAB — URINE CULTURE: Culture: 20000 — AB

## 2024-06-17 ENCOUNTER — Encounter: Payer: Self-pay | Admitting: Surgery

## 2024-06-17 ENCOUNTER — Other Ambulatory Visit: Payer: Self-pay

## 2024-06-17 ENCOUNTER — Ambulatory Visit: Payer: Self-pay | Admitting: Urgent Care

## 2024-06-17 ENCOUNTER — Encounter
Admission: RE | Disposition: A | Discharge status: Home / Self Care | Payer: Self-pay | Source: Home / Self Care | Attending: Surgery

## 2024-06-17 ENCOUNTER — Ambulatory Visit
Admission: RE | Admit: 2024-06-17 | Discharge: 2024-06-17 | Disposition: A | Discharge status: Home / Self Care | Attending: Surgery | Admitting: Surgery

## 2024-06-17 ENCOUNTER — Ambulatory Visit

## 2024-06-17 DIAGNOSIS — M7582 Other shoulder lesions, left shoulder: Secondary | ICD-10-CM | POA: Insufficient documentation

## 2024-06-17 DIAGNOSIS — M19012 Primary osteoarthritis, left shoulder: Secondary | ICD-10-CM | POA: Diagnosis present

## 2024-06-17 DIAGNOSIS — K219 Gastro-esophageal reflux disease without esophagitis: Secondary | ICD-10-CM | POA: Insufficient documentation

## 2024-06-17 DIAGNOSIS — G4730 Sleep apnea, unspecified: Secondary | ICD-10-CM | POA: Diagnosis not present

## 2024-06-17 HISTORY — PX: REVERSE SHOULDER ARTHROPLASTY: SHX5054

## 2024-06-17 SURGERY — ARTHROPLASTY, SHOULDER, TOTAL, REVERSE
Anesthesia: General | Site: Shoulder | Laterality: Left

## 2024-06-17 MED ORDER — CEFAZOLIN SODIUM-DEXTROSE 2-4 GM/100ML-% IV SOLN
INTRAVENOUS | Status: AC
  Filled 2024-06-17: qty 100

## 2024-06-17 MED ORDER — OXYCODONE HCL 5 MG PO TABS: 5.0000 mg | ORAL_TABLET | ORAL | 0 refills | Status: AC | PRN

## 2024-06-17 MED ORDER — LIDOCAINE HCL (PF) 2 % IJ SOLN
INTRAMUSCULAR | Status: AC
  Filled 2024-06-17: qty 5

## 2024-06-17 MED ORDER — BUPIVACAINE-EPINEPHRINE (PF) 0.5% -1:200000 IJ SOLN
INTRAMUSCULAR | Status: AC
  Filled 2024-06-17: qty 30

## 2024-06-17 MED ORDER — OXYCODONE HCL 5 MG PO TABS: 5.0000 mg | ORAL_TABLET | Freq: Once | ORAL | Status: DC | PRN

## 2024-06-17 MED ORDER — ONDANSETRON HCL 4 MG/2ML IJ SOLN
INTRAMUSCULAR | Status: AC
  Filled 2024-06-17: qty 2

## 2024-06-17 MED ORDER — KETOROLAC TROMETHAMINE 15 MG/ML IJ SOLN
15.0000 mg | Freq: Once | INTRAMUSCULAR | Status: AC
Start: 2024-06-17 — End: 2024-06-17
  Administered 2024-06-17: 15 mg via INTRAVENOUS

## 2024-06-17 MED ORDER — DEXAMETHASONE SODIUM PHOSPHATE 10 MG/ML IJ SOLN
INTRAMUSCULAR | Status: DC | PRN
  Administered 2024-06-17: 5 mg via INTRAVENOUS

## 2024-06-17 MED ORDER — LACTATED RINGERS IV SOLN: INTRAVENOUS | Status: DC

## 2024-06-17 MED ORDER — ONDANSETRON HCL 4 MG PO TABS: 4.0000 mg | ORAL_TABLET | Freq: Four times a day (QID) | ORAL | Status: DC | PRN

## 2024-06-17 MED ORDER — OXYCODONE HCL 5 MG/5ML PO SOLN: 5.0000 mg | Freq: Once | ORAL | Status: DC | PRN

## 2024-06-17 MED ORDER — BUPIVACAINE-EPINEPHRINE (PF) 0.5% -1:200000 IJ SOLN
INTRAMUSCULAR | Status: DC | PRN
  Administered 2024-06-17: 30 mL via PERINEURAL

## 2024-06-17 MED ORDER — KETOROLAC TROMETHAMINE 15 MG/ML IJ SOLN
INTRAMUSCULAR | Status: AC
  Filled 2024-06-17: qty 1

## 2024-06-17 MED ORDER — BUPIVACAINE LIPOSOME 1.3 % IJ SUSP
INTRAMUSCULAR | Status: DC | PRN
  Administered 2024-06-17: 20 mL

## 2024-06-17 MED ORDER — CEFAZOLIN SODIUM-DEXTROSE 2-4 GM/100ML-% IV SOLN
2.0000 g | Freq: Four times a day (QID) | INTRAVENOUS | Status: DC
  Administered 2024-06-17: 2 g via INTRAVENOUS

## 2024-06-17 MED ORDER — METOCLOPRAMIDE HCL 10 MG PO TABS: 5.0000 mg | ORAL_TABLET | Freq: Three times a day (TID) | ORAL | Status: DC | PRN

## 2024-06-17 MED ORDER — METOCLOPRAMIDE HCL 5 MG/ML IJ SOLN: 5.0000 mg | Freq: Three times a day (TID) | INTRAMUSCULAR | Status: DC | PRN

## 2024-06-17 MED ORDER — CEFAZOLIN SODIUM-DEXTROSE 2-4 GM/100ML-% IV SOLN
2.0000 g | INTRAVENOUS | Status: AC
Start: 2024-06-17 — End: 2024-06-17
  Administered 2024-06-17: 2 g via INTRAVENOUS

## 2024-06-17 MED ORDER — FENTANYL CITRATE (PF) 100 MCG/2ML IJ SOLN
INTRAMUSCULAR | Status: AC
  Filled 2024-06-17: qty 2

## 2024-06-17 MED ORDER — ACETAMINOPHEN 325 MG PO TABS: 325.0000 mg | ORAL_TABLET | Freq: Four times a day (QID) | ORAL | Status: DC | PRN

## 2024-06-17 MED ORDER — LIDOCAINE HCL (PF) 1 % IJ SOLN
INTRAMUSCULAR | Status: AC
  Filled 2024-06-17: qty 5

## 2024-06-17 MED ORDER — ONDANSETRON HCL 4 MG/2ML IJ SOLN
INTRAMUSCULAR | Status: DC | PRN
  Administered 2024-06-17: 4 mg via INTRAVENOUS

## 2024-06-17 MED ORDER — BUPIVACAINE LIPOSOME 1.3 % IJ SUSP
INTRAMUSCULAR | Status: AC
  Filled 2024-06-17: qty 20

## 2024-06-17 MED ORDER — ONDANSETRON HCL 4 MG/2ML IJ SOLN: 4.0000 mg | Freq: Four times a day (QID) | INTRAMUSCULAR | Status: DC | PRN

## 2024-06-17 MED ORDER — PHENYLEPHRINE 80 MCG/ML (10ML) SYRINGE FOR IV PUSH (FOR BLOOD PRESSURE SUPPORT)
PREFILLED_SYRINGE | INTRAVENOUS | Status: DC | PRN
  Administered 2024-06-17: 80 ug via INTRAVENOUS
  Administered 2024-06-17 (×2): 160 ug via INTRAVENOUS
  Administered 2024-06-17 (×6): 80 ug via INTRAVENOUS
  Administered 2024-06-17: 160 ug via INTRAVENOUS

## 2024-06-17 MED ORDER — CHLORHEXIDINE GLUCONATE 0.12 % MT SOLN
OROMUCOSAL | Status: AC
Start: 2024-06-17 — End: 2024-06-17
  Filled 2024-06-17: qty 15

## 2024-06-17 MED ORDER — OXYCODONE HCL 5 MG PO TABS: 5.0000 mg | ORAL_TABLET | ORAL | Status: DC | PRN

## 2024-06-17 MED ORDER — LIDOCAINE HCL (CARDIAC) PF 100 MG/5ML IV SOSY
PREFILLED_SYRINGE | INTRAVENOUS | Status: DC | PRN
  Administered 2024-06-17 (×2): 40 mg via INTRAVENOUS

## 2024-06-17 MED ORDER — KETOCONAZOLE 2 % EX CREA: 1.0000 | TOPICAL_CREAM | Freq: Every day | CUTANEOUS | Status: AC | PRN

## 2024-06-17 MED ORDER — TRANEXAMIC ACID-NACL 1000-0.7 MG/100ML-% IV SOLN
INTRAVENOUS | Status: AC
  Filled 2024-06-17: qty 100

## 2024-06-17 MED ORDER — SUGAMMADEX SODIUM 200 MG/2ML IV SOLN
INTRAVENOUS | Status: DC | PRN
  Administered 2024-06-17: 200 mg via INTRAVENOUS

## 2024-06-17 MED ORDER — MIDAZOLAM HCL 2 MG/2ML IJ SOLN
1.0000 mg | INTRAMUSCULAR | Status: DC | PRN
  Administered 2024-06-17: 1 mg via INTRAVENOUS

## 2024-06-17 MED ORDER — ORAL CARE MOUTH RINSE: 15.0000 mL | Freq: Once | OROMUCOSAL | Status: AC

## 2024-06-17 MED ORDER — ROCURONIUM BROMIDE 100 MG/10ML IV SOLN
INTRAVENOUS | Status: DC | PRN
  Administered 2024-06-17: 10 mg via INTRAVENOUS
  Administered 2024-06-17: 50 mg via INTRAVENOUS
  Administered 2024-06-17: 10 mg via INTRAVENOUS

## 2024-06-17 MED ORDER — FENTANYL CITRATE (PF) 100 MCG/2ML IJ SOLN: 25.0000 ug | INTRAMUSCULAR | Status: DC | PRN

## 2024-06-17 MED ORDER — ACETAMINOPHEN 10 MG/ML IV SOLN
INTRAVENOUS | Status: AC
  Filled 2024-06-17: qty 100

## 2024-06-17 MED ORDER — MIDAZOLAM HCL 2 MG/2ML IJ SOLN
INTRAMUSCULAR | Status: AC
  Filled 2024-06-17: qty 2

## 2024-06-17 MED ORDER — FENTANYL CITRATE PF 50 MCG/ML IJ SOSY
PREFILLED_SYRINGE | INTRAMUSCULAR | Status: AC
  Filled 2024-06-17: qty 1

## 2024-06-17 MED ORDER — LACTATED RINGERS IV SOLN: INTRAVENOUS | Status: DC | PRN

## 2024-06-17 MED ORDER — TRANEXAMIC ACID-NACL 1000-0.7 MG/100ML-% IV SOLN
1000.0000 mg | INTRAVENOUS | Status: AC
Start: 2024-06-17 — End: 2024-06-17
  Administered 2024-06-17: 1000 mg via INTRAVENOUS

## 2024-06-17 MED ORDER — PHENYLEPHRINE HCL-NACL 20-0.9 MG/250ML-% IV SOLN
INTRAVENOUS | Status: AC
  Filled 2024-06-17: qty 250

## 2024-06-17 MED ORDER — SODIUM CHLORIDE 0.9 % IV SOLN: INTRAVENOUS | Status: DC

## 2024-06-17 MED ORDER — FENTANYL CITRATE (PF) 100 MCG/2ML IJ SOLN
INTRAMUSCULAR | Status: DC | PRN
  Administered 2024-06-17 (×2): 25 ug via INTRAVENOUS
  Administered 2024-06-17: 50 ug via INTRAVENOUS

## 2024-06-17 MED ORDER — PROPOFOL 10 MG/ML IV BOLUS
INTRAVENOUS | Status: DC | PRN
  Administered 2024-06-17: 150 mg via INTRAVENOUS

## 2024-06-17 MED ORDER — DEXAMETHASONE SODIUM PHOSPHATE 10 MG/ML IJ SOLN
INTRAMUSCULAR | Status: AC
  Filled 2024-06-17: qty 1

## 2024-06-17 MED ORDER — CHLORHEXIDINE GLUCONATE 0.12 % MT SOLN
15.0000 mL | Freq: Once | OROMUCOSAL | Status: AC
  Administered 2024-06-17: 15 mL via OROMUCOSAL

## 2024-06-17 MED ORDER — EPHEDRINE SULFATE-NACL 50-0.9 MG/10ML-% IV SOSY
PREFILLED_SYRINGE | INTRAVENOUS | Status: DC | PRN
  Administered 2024-06-17 (×2): 5 mg via INTRAVENOUS
  Administered 2024-06-17: 10 mg via INTRAVENOUS
  Administered 2024-06-17: 5 mg via INTRAVENOUS

## 2024-06-17 MED ORDER — LIDOCAINE HCL (PF) 1 % IJ SOLN
INTRAMUSCULAR | Status: DC | PRN
  Administered 2024-06-17: 3 mL

## 2024-06-17 MED ORDER — ROCURONIUM BROMIDE 10 MG/ML (PF) SYRINGE
PREFILLED_SYRINGE | INTRAVENOUS | Status: AC
  Filled 2024-06-17: qty 10

## 2024-06-17 MED ORDER — FENTANYL CITRATE PF 50 MCG/ML IJ SOSY
50.0000 ug | PREFILLED_SYRINGE | Freq: Once | INTRAMUSCULAR | Status: AC
  Administered 2024-06-17: 50 ug via INTRAVENOUS

## 2024-06-17 MED ORDER — PHENYLEPHRINE HCL-NACL 20-0.9 MG/250ML-% IV SOLN
INTRAVENOUS | Status: DC | PRN
  Administered 2024-06-17: 30 ug/min via INTRAVENOUS

## 2024-06-17 SURGICAL SUPPLY — 65 items
BASEPLATE AUG FULL 24 20D (Plate) IMPLANT
BASEPLATE BOSS DRILL (MISCELLANEOUS) IMPLANT
BIT DRILL F/BASEPLATE CENTRAL (BIT) IMPLANT
BIT DRILL FLUTED 3.0 STRL (BIT) IMPLANT
BLADE SAW SAG 25X90X1.19 (BLADE) ×1 IMPLANT
CHLORAPREP W/TINT 26 (MISCELLANEOUS) ×1 IMPLANT
COOLER POLAR GLACIER W/PUMP (MISCELLANEOUS) ×1 IMPLANT
CUP SUT UNIV REVERS 39 NEU (Shoulder) IMPLANT
DRAPE INCISE IOBAN 66X45 STRL (DRAPES) ×1 IMPLANT
DRAPE SHEET LG 3/4 BI-LAMINATE (DRAPES) ×1 IMPLANT
DRAPE TABLE BACK 80X90 (DRAPES) ×1 IMPLANT
DRSG OPSITE POSTOP 4X8 (GAUZE/BANDAGES/DRESSINGS) ×1 IMPLANT
ELECT CAUTERY BLADE 6.4 (BLADE) ×1 IMPLANT
ELECTRODE BLDE 4.0 EZ CLN MEGD (MISCELLANEOUS) ×1 IMPLANT
ELECTRODE REM PT RTRN 9FT ADLT (ELECTROSURGICAL) ×1 IMPLANT
GAUZE XEROFORM 1X8 LF (GAUZE/BANDAGES/DRESSINGS) ×1 IMPLANT
GLENOSPHERE 39+4 LAT/24 UNI RV (Joint) IMPLANT
GLOVE BIO SURGEON STRL SZ7.5 (GLOVE) ×4 IMPLANT
GLOVE BIO SURGEON STRL SZ8 (GLOVE) ×4 IMPLANT
GLOVE BIOGEL PI IND STRL 8 (GLOVE) ×2 IMPLANT
GLOVE INDICATOR 8.0 STRL GRN (GLOVE) ×1 IMPLANT
GOWN STRL REUS W/ TWL LRG LVL3 (GOWN DISPOSABLE) ×2 IMPLANT
GOWN STRL REUS W/ TWL XL LVL3 (GOWN DISPOSABLE) ×1 IMPLANT
GUIDE PIN 2.0 X 150 (WIRE) IMPLANT
HANDLE YANKAUER SUCT OPEN TIP (MISCELLANEOUS) ×1 IMPLANT
HOOD PEEL AWAY T7 (MISCELLANEOUS) ×3 IMPLANT
INSERT HUMERAL 39/+6 (Insert) IMPLANT
KIT STABILIZATION SHOULDER (MISCELLANEOUS) ×1 IMPLANT
KIT TURNOVER KIT A (KITS) ×1 IMPLANT
MANIFOLD NEPTUNE II (INSTRUMENTS) ×1 IMPLANT
MASK FACE SPIDER DISP (MASK) ×1 IMPLANT
MAT ABSORB FLUID 56X50 GRAY (MISCELLANEOUS) ×1 IMPLANT
NDL MAYO CATGUT SZ1 (NEEDLE) IMPLANT
NDL SAFETY ECLIPSE 18X1.5 (NEEDLE) ×1 IMPLANT
NDL SPNL 20GX3.5 QUINCKE YW (NEEDLE) ×1 IMPLANT
NEEDLE MAYO CATGUT SZ1 (NEEDLE) IMPLANT
NEEDLE SPNL 20GX3.5 QUINCKE YW (NEEDLE) ×1 IMPLANT
PACK ARTHROSCOPY SHOULDER (MISCELLANEOUS) ×1 IMPLANT
PAD ARMBOARD POSITIONER FOAM (MISCELLANEOUS) ×1 IMPLANT
PAD WRAPON POLAR SHDR UNIV (MISCELLANEOUS) ×1 IMPLANT
PENCIL SMOKE EVACUATOR (MISCELLANEOUS) ×1 IMPLANT
PIN NITINOL TARGETER 2.8 (PIN) IMPLANT
POST MODLR FOR MGS BSPLATE 30 (Miscellaneous) IMPLANT
REAMER ANGLED HEAD SMALL (DRILL) IMPLANT
SCREW PERI LOCK 5.5X16 (Screw) IMPLANT
SCREW PERI LOCK 5.5X32 (Screw) IMPLANT
SCREW PERIPHERAL 5.5X20 LOCK (Screw) IMPLANT
SCREW PERIPHERAL NL 4.5X36 (Screw) IMPLANT
SLING ULTRA II LG (MISCELLANEOUS) IMPLANT
SLING ULTRA II M (MISCELLANEOUS) IMPLANT
SOL .9 NS 3000ML IRR UROMATIC (IV SOLUTION) ×1 IMPLANT
SPONGE T-LAP 18X18 ~~LOC~~+RFID (SPONGE) ×2 IMPLANT
STAPLER SKIN PROX 35W (STAPLE) ×1 IMPLANT
STEM HUM UNIV REV SZ11 (Stem) IMPLANT
SUT ETHIBOND 0 MO6 C/R (SUTURE) ×1 IMPLANT
SUT VIC AB 0 CT1 36 (SUTURE) ×1 IMPLANT
SUT VIC AB 2-0 CT1 TAPERPNT 27 (SUTURE) ×2 IMPLANT
SUTURE FIBERWR #2 38 BLUE 1/2 (SUTURE) ×4 IMPLANT
SYR 10ML LL (SYRINGE) ×1 IMPLANT
SYR 30ML LL (SYRINGE) ×1 IMPLANT
SYR TOOMEY 50ML (SYRINGE) ×1 IMPLANT
TIP FAN IRRIG PULSAVAC PLUS (DISPOSABLE) ×1 IMPLANT
TRANSPORT LOANER FEE ARTHREX (MISCELLANEOUS) IMPLANT
TRAP FLUID SMOKE EVACUATOR (MISCELLANEOUS) ×1 IMPLANT
WATER STERILE IRR 500ML POUR (IV SOLUTION) ×1 IMPLANT

## 2024-06-17 NOTE — H&P (Signed)
 History of Present Illness:  Harold Nelson is a 89 y.o. male who has been referred by Gustavo Level, PA-C, for evaluation and treatment of his left shoulder pain. The patient notes that his symptoms been present for many years and he has been diagnosed with degenerative joint disease of the shoulder. He has had several discussions with Gustavo Level, PA-C, about a reverse total shoulder arthroplasty. However, his symptoms were not sufficiently severe as to warrant this procedure. However, the patient recalls falling in June and aggravating his left shoulder. Since then, his symptoms have worsened. After following up with Gustavo, he was sent for a CT scan of the shoulder and referred to me for further evaluation and treatment. The patient denies any pain in the shoulder at rest, but notes that his symptoms can be moderate to severe with activities or when he tries to sleep on his left side at night. He especially notes pain with any activities at or above shoulder level or when trying to reach behind his back. He denies any numbness or paresthesias down his arm to his hand, but notes that he is status post a prior ulnar nerve transposition for cubital tunnel syndrome 20 years ago. He has tried ibuprofen and various other over-the-counter medications with limited benefit.  Current Outpatient Medications:  cholecalciferol (VITAMIN D3) 1,000 unit capsule Take 1,000 Units by mouth once daily  coenzyme Q10 400 mg Cap Take 1 capsule by mouth once daily.  dorzolamide-timoloL  (COSOPT) 22.3-6.8 mg/mL ophthalmic solution As directed  esomeprazole (NEXIUM) 40 MG DR capsule Take 1 capsule (40 mg total) by mouth once daily 90 capsule 1  ibuprofen (MOTRIN) 200 MG tablet Take 200 mg by mouth every 6 (six) hours as needed for Pain  ketoconazole  (NIZORAL ) 2 % cream Apply topically 2 (two) times daily 60 g 2  meloxicam (MOBIC) 7.5 MG tablet Take 7.5 mg by mouth once daily  nitrofurantoin, macrocrystal-monohydrate, (MACROBID)  100 MG capsule Take 1 capsule (100 mg total) by mouth 2 (two) times daily for 10 days 20 capsule 0  SAW/PYGEUM/NETTLE/PUMPKN/AA#17 (PROSTATE HEALTH FORMULA ORAL) Take 1 capsule by mouth once daily.  tamsulosin  (FLOMAX ) 0.4 mg capsule Take 0.4 mg by mouth once daily  tumeric-ging-olive-oreg-capryl 100 mg-150 mg- 50 mg-150 mg Cap Take 1 capsule by mouth once daily   Allergies: No Known Allergies  Past Medical History:  Chicken pox  Dislocated shoulder  Hemorrhoids  Hyperlipidemia  Hyperplastic colon polyp 05/07/2016  Shingles  Tubular adenoma of colon 05/07/2016  Ulnar neuropathy   Past Surgical History:  Broken right elbow 1948  Removal of benign growth 1958 (behind his ear)  Dislocated shoulder 1966  Back surgery - Microdisectomy 1985  Left ulnar nerve surgery 03/2004  Left knee arthroscopy 01/16/2006  COLONOSCOPY 05/06/2006  Left hip osteoarthritis, possible avascular necrosis Left 08/19/14  COLONOSCOPY 05/07/2016 (Tubular adenoma of colon/Hyperplastic colon polyp/Repeat 61yrs/MUS)  Total hip arthroplasty anterior approach right Right 08/12/2017 (Dr. Kathlynn)   Family History:  High blood pressure (Hypertension) Mother  Brain cancer Mother  Osteoporosis (Thinning of bones) Mother  Other Father  Benign prostatic hypertrophy   Social History:   Socioeconomic History:  Marital status: Married  Tobacco Use  Smoking status: Never  Passive exposure: Never  Smokeless tobacco: Never  Vaping Use  Vaping status: Never Used  Substance and Sexual Activity  Alcohol use: Yes  Alcohol/week: 0.0 standard drinks of alcohol  Comment: occasionally  Drug use: No  Sexual activity: Defer   Social Drivers of Health:  Financial Resource Strain: Low Risk (04/16/2024)  Overall Financial Resource Strain (CARDIA)  Difficulty of Paying Living Expenses: Not hard at all  Food Insecurity: No Food Insecurity (04/16/2024)  Hunger Vital Sign  Worried About Running Out of Food in the Last Year:  Never true  Ran Out of Food in the Last Year: Never true  Transportation Needs: No Transportation Needs (04/16/2024)  PRAPARE - Risk analyst (Medical): No  Lack of Transportation (Non-Medical): No   Review of Systems:  A comprehensive 14 point ROS was performed, reviewed, and the pertinent orthopaedic findings are documented in the HPI.  Physical Exam: Vitals:  06/14/24 1306  BP: 122/70  Weight: 79.4 kg (175 lb)  Height: 165.1 cm (5' 5)  PainSc: 0-No pain  PainLoc: Shoulder   General/Constitutional: The patient appears to be well-nourished, well-developed, and in no acute distress. Neuro/Psych: Normal mood and affect, oriented to person, place and time. Eyes: Non-icteric. Pupils are equal, round, and reactive to light, and exhibit synchronous movement. ENT: Unremarkable. Lymphatic: No palpable adenopathy. Respiratory: Lungs clear to auscultation, Normal chest excursion, No wheezes, and Non-labored breathing Cardiovascular: Regular rate and rhythm. No murmurs. and No edema, swelling or tenderness, except as noted in detailed exam. Integumentary: No impressive skin lesions present, except as noted in detailed exam. Musculoskeletal: Unremarkable, except as noted in detailed exam.  Left shoulder exam: Skin inspection of the left shoulder is unremarkable. No swelling, erythema, ecchymosis, abrasions, or other skin abnormalities are identified. He has minimal tenderness to palpation over the anterolateral aspect of the shoulder. Actively, he is able to forward flex to 150 degrees, abduct to 130 degrees, and internally rotate to his buttock. Passively, he can tolerate forward flexion to 155 degrees and abduction to 135 degrees. At 90 degrees of abduction, he is able to tolerate external rotation to 75 degrees and internal rotation to 60 degrees. He experiences moderate pain and crepitance with all motions. He exhibits 2+-3/5 strength with resisted forward flexion and  abduction, 4/5 strength with resisted internal rotation, and 3+-4/5 strength with resisted external rotation. He has mild pain with resisted forward flexion and abduction. He is grossly neurovascularly intact to the left upper extremity and hand.  Imaging:  A recent CT scan of the left shoulder is available for review and has been reviewed by myself. By report, the study demonstrates evidence of severe osteoarthritis of the left glenohumeral joint. No obvious rotator cuff pathology is identified. Both the films and report were reviewed by myself and discussed with the patient and his wife.  Assessment: Primary osteoarthritis of left shoulder.  Rotator cuff tendinitis, left.   Plan: The treatment options were discussed with the patient and his wife. In addition, patient educational materials were provided regarding the diagnosis and treatment options. The patient is quite frustrated by his symptoms and function limitations, and would like to consider more aggressive treatment options. Therefore, I have recommended a surgical procedure, specifically a reverse left total shoulder arthroplasty with biceps tenodesis. The procedure was discussed with the patient, as were the potential risks (including bleeding, infection, nerve and/or blood vessel injury, persistent or recurrent pain, loosening and/or failure of the components, dislocation, need for further surgery, blood clots, strokes, heart attacks and/or arhythmias, pneumonia, etc.) and benefits. The patient states his understanding and wishes to proceed. All of the patient's questions and concerns were answered. He can call any time with further concerns. He will follow up post-surgery, routine.    H&P reviewed and patient re-examined. No  changes.

## 2024-06-17 NOTE — Anesthesia Postprocedure Evaluation (Signed)
 Anesthesia Post Note  Patient: Harold Nelson  Procedure(s) Performed: ARTHROPLASTY, SHOULDER, TOTAL, REVERSE (Left: Shoulder)  Patient location during evaluation: PACU Anesthesia Type: General Level of consciousness: awake and alert Pain management: pain level controlled Vital Signs Assessment: post-procedure vital signs reviewed and stable Respiratory status: spontaneous breathing, nonlabored ventilation, respiratory function stable and patient connected to nasal cannula oxygen Cardiovascular status: blood pressure returned to baseline and stable Postop Assessment: no apparent nausea or vomiting Anesthetic complications: no   No notable events documented.   Last Vitals:  Vitals:   06/17/24 1344 06/17/24 1347  BP:  95/77  Pulse:  74  Resp:  15  Temp: (!) 36.4 C   SpO2:  94%    Last Pain:  Vitals:   06/17/24 1347  TempSrc:   PainSc: 0-No pain                 Lendia LITTIE Mae

## 2024-06-17 NOTE — Evaluation (Signed)
 Occupational Therapy Evaluation Patient Details Name: Harold Nelson MRN: [id number] DOB: 08/22/1935 Today's Date: 06/17/2024   History of Present Illness   89yo male s/p L reverse TSA on 06/17/24.     Clinical Impressions Patient was seen for an OT evaluation this date. Pt lives with his spouse in an independent living cottage at [facility]. Prior to surgery, pt was active and independent. Pt has orders for LUE to be immobilized and will be NWBing per MD. Patient presents with impaired strength/ROM, and sensation to LUE with block in place. These impairments result in a decreased ability to perform self care tasks requiring mod assist for UB/LB dressing and bathing and max assist for application of polar care, compression stockings, and sling/immobilizer. Pt/spouse instructed in polar care mgt, compression stockings mgt, sling/immobilizer mgt, LUE precautions, adaptive strategies for bathing/dressing/toileting/grooming, positioning and considerations for sleep, and home/routines modifications to maximize falls prevention, safety, and independence. Handout provided. OT adjusted sling/immobilizer and polar care to improve comfort, optimize positioning, and to maximize skin integrity/safety. Pt/spouse verbalized understanding of all education/training provided. Recommend continued therapy for rehab of shoulder.     If plan is discharge home, recommend the following:   A lot of help with bathing/dressing/bathroom;Assist for transportation;Assistance with cooking/housework     Functional Status Assessment   Patient has had a recent decline in their functional status and demonstrates the ability to make significant improvements in function in a reasonable and predictable amount of time.     Equipment Recommendations   None recommended by OT     Recommendations for Other Services         Precautions/Restrictions   Precautions Precautions: Shoulder Shoulder Interventions: Shoulder  sling/immobilizer;Don joy ultra sling;Shoulder abduction pillow;At all times;Off for dressing/bathing/exercises Precaution Booklet Issued: Yes (comment) Recall of Precautions/Restrictions: Intact Restrictions Weight Bearing Restrictions Per Provider Order: Yes LUE Weight Bearing Per Provider Order: Non weight bearing     Mobility Bed Mobility               General bed mobility comments: NT in recliner pre and post session    Transfers Overall transfer level: Needs assistance Equipment used: None Transfers: Sit to/from Stand Sit to Stand: Contact guard assist                  Balance Overall balance assessment: Mild deficits observed, not formally tested                                         ADL either performed or assessed with clinical judgement   ADL Overall ADL's : Needs assistance/impaired                 Upper Body Dressing : Sitting;Cueing for UE precautions;Moderate assistance Upper Body Dressing Details (indicate cue type and reason): instructed caregiver throughout Lower Body Dressing: Sit to/from stand;Moderate assistance;With caregiver independent assisting   Toilet Transfer: Regular Toilet;Contact guard assist Toilet Transfer Details (indicate cue type and reason): stood at toilet to urinate, SBA-CGA for safety as first time up Toileting- Clothing Manipulation and Hygiene: Minimal assistance Toileting - Clothing Manipulation Details (indicate cue type and reason): MIN A for clothing mgt over L hip     Functional mobility during ADLs: Contact guard assist       Vision         Perception         Praxis  Pertinent Vitals/Pain Pain Assessment Pain Assessment: No/denies pain     Extremity/Trunk Assessment Upper Extremity Assessment Upper Extremity Assessment: Left hand dominant;LUE deficits/detail LUE Deficits / Details: pins and needles in hand, able to move fingers some LUE: Unable to fully assess  due to immobilization LUE Sensation: decreased light touch;decreased proprioception LUE Coordination: decreased fine motor;decreased gross motor   Lower Extremity Assessment Lower Extremity Assessment: Overall WFL for tasks assessed       Communication Communication Communication: No apparent difficulties   Cognition Arousal: Alert Behavior During Therapy: WFL for tasks assessed/performed Cognition: No apparent impairments                               Following commands: Intact       Cueing  General Comments   Cueing Techniques: Verbal cues      Exercises Other Exercises Other Exercises: Pt/spouse instructed in shoulder precautions, polar care mgt, compression stocking mgt, shoulder sling/immobilizer mgt, modified strategies for underarm grooming, bathing, and dressing. Positioning of LUE when sitting versus laying down. Handout provided.   Shoulder Instructions      Home Living Family/patient expects to be discharged to:: Private residence Living Arrangements: Spouse/significant other Available Help at Discharge: Family;Available 24 hours/day Type of Home: Other(Comment) (cottage)       Home Layout: One level     Bathroom Shower/Tub: Producer, television/film/video: Handicapped height     Home Equipment: Shower seat;Grab bars - tub/shower;Grab bars - toilet;BSC/3in1;Cane - Programmer, applications (2 wheels)          Prior Functioning/Environment Prior Level of Function : Independent/Modified Independent;Driving;History of Falls (last six months)             Mobility Comments: hiking poles outside for 1-27mile walks, 1 fall leading to stress fracture in L shoulder ADLs Comments: indep with ADL, difficulty with raising LUE >45*, have house cleaners, active, goes to the gym, walking in the pool, walking    OT Problem List: Decreased strength;Decreased coordination;Decreased range of motion;Impaired sensation;Impaired balance (sitting and/or  standing);Decreased knowledge of precautions;Impaired UE functional use   OT Treatment/Interventions:        OT Goals(Current goals can be found in the care plan section)   Acute Rehab OT Goals Patient Stated Goal: go home OT Goal Formulation: All assessment and education complete, DC therapy   OT Frequency:       Co-evaluation              AM-PAC OT 6 Clicks Daily Activity     Outcome Measure Help from another person eating meals?: None Help from another person taking care of personal grooming?: A Little Help from another person toileting, which includes using toliet, bedpan, or urinal?: A Little Help from another person bathing (including washing, rinsing, drying)?: A Lot Help from another person to put on and taking off regular upper body clothing?: A Lot Help from another person to put on and taking off regular lower body clothing?: A Lot 6 Click Score: 16   End of Session Nurse Communication: Mobility status  Activity Tolerance: Patient tolerated treatment well Patient left: in chair;with call bell/phone within reach;with family/visitor present  OT Visit Diagnosis: Unsteadiness on feet (R26.81)                Time: 8494-8395 OT Time Calculation (min): 59 min Charges:  OT General Charges $OT Visit: 1 Visit OT Evaluation $OT Eval Moderate Complexity: 1  Mod OT Treatments $Self Care/Home Management : 38-52 mins  Warren SAUNDERS., MPH, MS, OTR/L ascom [phone] 06/17/24, 4:26 PM

## 2024-06-17 NOTE — Progress Notes (Signed)
 Patient is doing well, meets all requirements to safely be discharged home. Wife Jan was present for instructions.

## 2024-06-17 NOTE — Op Note (Signed)
 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  Post-Op Diagnosis:   Same with biceps tendinopathy, left shoulder.  Procedure:   Reverse left total shoulder arthroplasty with biceps tenodesis.  Surgeon:   DOROTHA Reyes Maltos, MD  Assistant:   Gustavo Level, PA-C; Dozier Cuba, PA-S  Anesthesia:   General endotracheal with an interscalene block using Exparel  placed preoperatively by the anesthesiologist.  Findings:   As above.  Complications:   None  EBL:   75 cc  Fluids:   600 cc crystalloid  UOP:   None  TT:   None  Drains:   None  Closure:   Staples  Implants:   All press-fit Arthrex system with a #11 Univers Revers humeral stem, a 39 mm SutureCup with a +6 mm insert, and a 24 mm 20 degree augmented base plate with a 39 mm +4 mm lateralized glenosphere.  Brief Clinical Note:   The patient is an 89 year old male with a long history of progressive worsening pain and weakness of his left shoulder. His symptoms have progressed despite medications, activity modification, etc. His history and examination are consistent with advanced degenerative joint disease with rotator cuff tendinopathy, all of which were confirmed by preoperative imaging. The patient presents at this time for a reverse left total shoulder arthroplasty.  Procedure:   The patient underwent placement of an interscalene block using Exparel  by the anesthesiologist in the preoperative holding area before being brought into the operating room and lain in the supine position. The patient then underwent general endotracheal intubation and anesthesia before the patient was repositioned in the beach chair position using the beach chair positioner. The left shoulder and upper extremity were prepped with ChloraPrep solution before being draped sterilely. Preoperative antibiotics were administered. A timeout was performed to  verify the appropriate surgical site.    A standard anterior approach to the shoulder was made through an approximately 4-5 inch incision. The incision was carried down through the subcutaneous tissues to expose the deltopectoral fascia. The interval between the deltoid and pectoralis muscles was identified and this plane developed, retracting the cephalic vein laterally with the deltoid muscle. The conjoined tendon was identified. Its lateral margin was dissected and the Kolbel self-retraining retractor inserted. The three sisters were identified and cauterized. Bursal tissues were removed to improve visualization.   The biceps tendon was identified near the inferior aspect of the bicipital groove. A soft tissue tenodesis was performed by attaching the biceps tendon to the adjacent pectoralis major tendon using two #0 Ethibond interrupted sutures. The biceps tendon was then transected just proximal to the tenodesis site. The subscapularis tendon was released from its attachment to the lesser tuberosity 1 cm proximal to its insertion and several tagging sutures placed. The inferior capsule was released with care after identifying and protecting the axillary nerve. The proximal humeral cut was made at approximately 30 of retroversion using the extra-medullary guide.   Attention was redirected to the glenoid. The labrum was debrided circumferentially before the center of the glenoid was marked with electrocautery.  Given the amount of posterior and superior wear identified, first the 10 degree augmented guide and then the 20 degree augmented guide was positioned.  It was felt that the 20 degree augmented guide most completely restored proper anatomy. The guidewire was drilled into the glenoid neck using the appropriate guide. After verifying its position, it was overreamed with the 20  degree augmented baseplate reamer to create a flat surface before the peripheral reamer was introduced to clean off any  peripheral soft tissues. The central 10 x 30 mm coring reamer was then inserted and advanced fully to complete the glenoid preparation.   The permanent mini-baseplate construct with a 30 mm post attachment was put together on the back table before being impacted into place. The baseplate was then further secured using four peripheral screws. Locking screws were placed superiorly, anteriorly, and posteriorly, while one non-locking screw was placed inferiorly. The permanent 39 mm +4 lateralized mm lateralized glenosphere was then impacted into place and its Morse taper locking mechanism verified using manual distraction. Finally, the glenosphere locking screw was inserted and tightened securely.  Attention was directed to the humeral side. The humeral canal was reamed with first the 5 mm and then the 6 mm reamer. The canal was broached beginning with a #5 broach and progressing to a #11 broach. This was left in place and the metaphyseal inset reamer was used to create the metaphyseal socket.  A trial reduction performed using the 39 mm trial humeral platform with first the +3 mm and then the +6 mm insert.  With the +6 mm insert, the arm demonstrated excellent range of motion as the hand could be brought across the chest to the opposite shoulder and brought to the top of the patient's head and to the patient's ear. The shoulder appeared stable throughout this range of motion. The joint was dislocated and the trial components removed. The permanent #11 Univers Revers stem was impacted into place with care taken to maintain the appropriate version. The permanent 39 mm SutureCup humeral platform was then impacted into place before the +6 mm insert was snapped into place. The shoulder was relocated using two finger pressure and again placed through a range of motion with the findings as described above.  The wound was copiously irrigated with sterile saline solution using the jet lavage system before a total of 30 cc  of 0.5% Sensorcaine  with epinephrine  was injected into the pericapsular and peri-incisional tissues to help with postoperative analgesia. The subscapularis tendon was reapproximated using #2 FiberWire interrupted sutures. The deltopectoral interval was closed using #0 Vicryl interrupted sutures before the subcutaneous tissues were closed using 2-0 Vicryl interrupted sutures. The skin was closed using staples. A sterile occlusive dressing was applied to the wound before the arm was placed into a shoulder immobilizer with an abduction pillow. A Polar Care system also was applied to the shoulder. The patient was then transferred back to a hospital bed before being awakened, extubated, and returned to the recovery room in satisfactory condition after tolerating the procedure well.

## 2024-06-17 NOTE — Discharge Instructions (Addendum)
 Orthopedic discharge instructions: May shower with intact OpSite dressing once nerve block has worn off (Monday).  Apply ice frequently to shoulder or use Polar Care device. May resume meloxicam 7.5 mg daily OR take ibuprofen 600 mg TID with meals for 3-5 days, then as necessary. Take oxycodone  as prescribed when needed.  May supplement with ES Tylenol  if necessary. Keep shoulder immobilizer on at all times except may remove for bathing purposes. Follow-up in 10-14 days or as scheduled.POLAR CARE INFORMATION  MassAdvertisement.it  How to use [employer] Polar Care [facility] Therapy System?  YouTube   ShippingScam.co.uk  OPERATING INSTRUCTIONS  Start the product With dry hands, connect the transformer to the electrical connection located on the top of the cooler. Next, plug the transformer into an appropriate electrical outlet. The unit will automatically start running at this point.  To stop the pump, disconnect electrical power.  Unplug to stop the product when not in use. Unplugging the Polar Care unit turns it off. Always unplug immediately after use. Never leave it plugged in while unattended. Remove pad.    FIRST ADD WATER TO FILL LINE, THEN ICE---Replace ice when existing ice is almost melted  1 Discuss Treatment with your Licensed Health Care Practitioner and Use Only as Prescribed 2 Apply Insulation Barrier & Cold Therapy Pad 3 Check for Moisture 4 Inspect Skin Regularly  Tips and Trouble Shooting Usage Tips 1. Use cubed or chunked ice for optimal performance. 2. It is recommended to drain the Pad between uses. To drain the pad, hold the Pad upright with the hose pointed toward the ground. Depress the black plunger and allow water to drain out. 3. You may disconnect the Pad from the unit without removing the pad from the affected area by depressing the silver tabs on the hose coupling and gently pulling the hoses apart. The Pad and unit will seal itself and will  not leak. Note: Some dripping during release is normal. 4. DO NOT RUN PUMP WITHOUT WATER! The pump in this unit is designed to run with water. Running the unit without water will cause permanent damage to the pump. 5. Unplug unit before removing lid.  TROUBLESHOOTING GUIDE Pump not running, Water not flowing to the pad, Pad is not getting cold 1. Make sure the transformer is plugged into the wall outlet. 2. Confirm that the ice and water are filled to the indicated levels. 3. Make sure there are no kinks in the pad. 4. Gently pull on the blue tube to make sure the tube/pad junction is straight. 5. Remove the pad from the treatment site and ll it while the pad is lying at; then reapply. 6. Confirm that the pad couplings are securely attached to the unit. Listen for the double clicks (Figure 1) to confirm the pad couplings are securely attached.  Leaks    Note: Some condensation on the lines, controller, and pads is unavoidable, especially in warmer climates. 1. If using a [employer] Polar Care Cold Therapy unit with a detachable Cold Therapy Pad, and a leak exists (other than condensation on the lines) disconnect the pad couplings. Make sure the silver tabs on the couplings are depressed before reconnecting the pad to the pump hose; then confirm both sides of the coupling are properly clicked in. 2. If the coupling continues to leak or a leak is detected in the pad itself, stop using it and call [employer] Customer Care at [phone].  Cleaning After use, empty and dry the unit with a soft  cloth. Warm water and mild detergent may be used occasionally to clean the pump and tubes.  WARNING: The Polar Care Cube can be cold enough to cause serious injury, including full skin necrosis. Follow these Operating Instructions, and carefully read the Product Insert (see pouch on side of unit) and the Cold Therapy Pad Fitting Instructions (provided with each Cold Therapy Pad) prior to use.     SHOULDER SLING  IMMOBILIZER   VIDEO Slingshot 2 Shoulder Brace Application - YouTube ---https://www.porter.info/  INSTRUCTIONS While supporting the injured arm, slide the forearm into the sling. Wrap the adjustable shoulder strap around the neck and shoulders and attach the strap end to the sling using  the "alligator strap tab."  Adjust the shoulder strap to the required length. Position the shoulder pad behind the neck. To secure the shoulder pad location (optional), pull the shoulder strap away from the shoulder pad, unfold the hook material on the top of the pad, then press the shoulder strap back onto the hook material to secure the pad in place. Attach the closure strap across the open top of the sling. Position the strap so that it holds the arm securely in the sling. Next, attach the thumb strap to the open end of the sling between the thumb and fingers. After sling has been fit, it may be easily removed and reapplied using the quick release buckle on shoulder strap. If a neutral pillow or 15 abduction pillow is included, place the pillow at the waistline. Attach the sling to the pillow, lining up hook material on the pillow with the loop on sling. Adjust the waist strap to fit.  If waist strap is too long, cut it to fit. Use the small piece of double sided hook material (located on top of the pillow) to secure the strap end. Place the double sided hook material on the inside of the cut strap end and secure it to the waist strap.     If no pillow is included, attach the waist strap to the sling and adjust to fit.    Washing Instructions: Straps and sling must be removed and cleaned regularly depending on your activity level and perspiration. Hand wash straps and sling in cold water with mild detergent, rinse, air dry     Information for Discharge Teaching: EXPAREL  (bupivacaine  liposome injectable suspension)   Pain relief is important to your recovery. The goal is to control your  pain so you can move easier and return to your normal activities as soon as possible after your procedure. Your physician may use several types of medicines to manage pain, swelling, and more.  Your surgeon or anesthesiologist gave you EXPAREL (bupivacaine ) to help control your pain after surgery.  EXPAREL  is a local anesthetic designed to release slowly over an extended period of time to provide pain relief by numbing the tissue around the surgical site. EXPAREL  is designed to release pain medication over time and can control pain for up to 72 hours. Depending on how you respond to EXPAREL , you may require less pain medication during your recovery. EXPAREL  can help reduce or eliminate the need for opioids during the first few days after surgery when pain relief is needed the most. EXPAREL  is not an opioid and is not addictive. It does not cause sleepiness or sedation.   Important! A teal colored band has been placed on your arm with the date, time and amount of EXPAREL  you have received. Please leave this armband in place for the full  96 hours following administration, and then you may remove the band. If you return to the hospital for any reason within 96 hours following the administration of EXPAREL , the armband provides important information that your health care providers to know, and alerts them that you have received this anesthetic.    Possible side effects of EXPAREL : Temporary loss of sensation or ability to move in the area where medication was injected. Nausea, vomiting, constipation Rarely, numbness and tingling in your mouth or lips, lightheadedness, or anxiety may occur. Call your doctor right away if you think you may be experiencing any of these sensations, or if you have other questions regarding possible side effects.  Follow all other discharge instructions given to you by your surgeon or nurse. Eat a healthy diet and drink plenty of water or other fluids.

## 2024-06-17 NOTE — Anesthesia Procedure Notes (Cosign Needed)
 Procedure Name: Intubation Date/Time: 06/17/2024 10:46 AM  Performed by: Chesley Lendia CROME, MDPre-anesthesia Checklist: Patient identified, Emergency Drugs available, Suction available and Patient being monitored Patient Re-evaluated:Patient Re-evaluated prior to induction Oxygen Delivery Method: Circle system utilized Preoxygenation: Pre-oxygenation with 100% oxygen Induction Type: IV induction Ventilation: Mask ventilation without difficulty Laryngoscope Size: McGrath and 4 Grade View: Grade I Tube type: Oral Tube size: 7.5 mm Number of attempts: 1 Airway Equipment and Method: Stylet Placement Confirmation: ETT inserted through vocal cords under direct vision, positive ETCO2 and breath sounds checked- equal and bilateral Secured at: 22 cm Tube secured with: Tape Dental Injury: Teeth and Oropharynx as per pre-operative assessment  Comments: Atraumatic intubation. Teeth unchanged from prior

## 2024-06-17 NOTE — Transfer of Care (Cosign Needed)
 Immediate Anesthesia Transfer of Care Note  Patient: Harold Nelson  Procedure(s) Performed: ARTHROPLASTY, SHOULDER, TOTAL, REVERSE (Left: Shoulder)  Patient Location: PACU  Anesthesia Type:General  Level of Consciousness: awake, alert , oriented, and patient cooperative  Airway & Oxygen Therapy: Patient Spontanous Breathing and Patient connected to face mask oxygen  Post-op Assessment: Report given to RN and Post -op Vital signs reviewed and stable  Post vital signs: Reviewed and stable  Last Vitals:  Vitals Value Taken Time  BP 124/82 06/17/24 13:15  Temp    Pulse 79 06/17/24 13:18  Resp 17 06/17/24 13:18  SpO2 99 % 06/17/24 13:18  Vitals shown include unfiled device data.  Last Pain:  Vitals:   06/17/24 0911  TempSrc: Temporal  PainSc: 0-No pain         Complications: No notable events documented.

## 2024-06-17 NOTE — Anesthesia Preprocedure Evaluation (Addendum)
 Anesthesia Evaluation  Patient identified by MRN, date of birth, ID band Patient awake    Reviewed: Allergy & Precautions, NPO status , Patient's Chart, lab work & pertinent test results  History of Anesthesia Complications Negative for: history of anesthetic complications  Airway Mallampati: II  TM Distance: >3 FB Neck ROM: full    Dental no notable dental hx.    Pulmonary sleep apnea    Pulmonary exam normal        Cardiovascular negative cardio ROS Normal cardiovascular exam     Neuro/Psych  Neuromuscular disease  negative psych ROS   GI/Hepatic Neg liver ROS,GERD  Medicated,,  Endo/Other  negative endocrine ROS    Renal/GU      Musculoskeletal   Abdominal   Peds  Hematology negative hematology ROS (+)   Anesthesia Other Findings Past Medical History: No date: Actinic keratosis No date: Dislocated shoulder No date: GERD (gastroesophageal reflux disease) No date: Hemorrhoids No date: Hyperlipidemia 2012: Shingles No date: Sleep apnea     Comment:  No C-PAP, changed sleep position 09/16/2013: Squamous cell carcinoma of skin     Comment:  Left forearm. SCCis.  01/13/2018: Squamous cell carcinoma of skin     Comment:  Left mid vertex. MD SCC.  05/25/2018: Squamous cell carcinoma of skin     Comment:  Vertex crown/ant. scalp. WD SCC with superficial               infiltration.  08/02/2020: Squamous cell carcinoma of skin     Comment:  Superior sternum - ED&C  03/04/2024: Squamous cell carcinoma of skin     Comment:  SCC IS right posterior vertex scalp, clear to follow up,              Mohs no longer needed No date: Ulnar neuropathy  Past Surgical History: No date: BACK SURGERY No date: back surgery microdisectomy No date: broken right elbow No date: COLONOSCOPY 05/07/2016: COLONOSCOPY WITH PROPOFOL ; N/A     Comment:  Procedure: COLONOSCOPY WITH PROPOFOL ;  Surgeon: Gladis RAYMOND Mariner,  MD;  Location: [facility] ENDOSCOPY;  Service:               Endoscopy;  Laterality: N/A; No date: FRACTURE SURGERY No date: JOINT REPLACEMENT; Bilateral     Comment:  hips No date: left knee arthroscopy No date: left ulnar nerve surgery No date: removal of benign growth; Left     Comment:  behind ear 08/12/2017: TOTAL HIP ARTHROPLASTY; Right     Comment:  Procedure: TOTAL HIP ARTHROPLASTY ANTERIOR APPROACH;                Surgeon: Kathlynn Sharper, MD;  Location: [facility] ORS;                Service: Orthopedics;  Laterality: Right;     Reproductive/Obstetrics negative OB ROS                              Anesthesia Physical Anesthesia Plan  ASA: 2  Anesthesia Plan: General ETT   Post-op Pain Management: Regional block*, Ofirmev  IV (intra-op)* and Toradol  IV (intra-op)*   Induction: Intravenous  PONV Risk Score and Plan: 2 and Ondansetron , Dexamethasone , Midazolam  and Treatment may vary due to age or medical condition  Airway Management Planned: Oral ETT  Additional Equipment:   Intra-op Plan:   Post-operative Plan: Extubation in  OR  Informed Consent: I have reviewed the patients History and Physical, chart, labs and discussed the procedure including the risks, benefits and alternatives for the proposed anesthesia with the patient or authorized representative who has indicated his/her understanding and acceptance.     Dental Advisory Given  Plan Discussed with: Anesthesiologist, CRNA and Surgeon  Anesthesia Plan Comments: (Patient consented for risks of anesthesia including but not limited to:  - adverse reactions to medications - damage to eyes, teeth, lips or other oral mucosa - nerve damage due to positioning  - sore throat or hoarseness - Damage to heart, brain, nerves, lungs, other parts of body or loss of life  Patient voiced understanding and assent.)         Anesthesia Quick Evaluation

## 2024-06-17 NOTE — Anesthesia Procedure Notes (Signed)
 Anesthesia Regional Block: Interscalene brachial plexus block   Pre-Anesthetic Checklist: , timeout performed,  Correct Patient, Correct Site, Correct Laterality,  Correct Procedure, Correct Position, site marked,  Risks and benefits discussed,  Surgical consent,  Pre-op evaluation,  At surgeon's request and post-op pain management  Laterality: Left  Prep: alcohol swabs       Needles:  Injection technique: Single-shot  Needle Type: Echogenic Needle     Needle Length: 4cm  Needle Gauge: 25     Additional Needles:   Procedures:,,,, ultrasound used (permanent image in chart),,    Narrative:  Start time: 06/17/2024 9:53 AM End time: 06/17/2024 9:55 AM Injection made incrementally with aspirations every 5 mL.  Performed by: Personally  Anesthesiologist: Chesley Lendia CROME, MD  Additional Notes: Patient's chart reviewed and they were deemed appropriate candidate for procedure, at surgeon's request. Patient educated about risks, benefits, and alternatives of the block including but not limited to: temporary or permanent nerve damage, bleeding, infection, damage to surround tissues, pneumothorax, hemidiaphragmatic paralysis, unilateral Horner's syndrome, block failure, local anesthetic toxicity. Patient expressed understanding. A formal time-out was conducted consistent with institution rules.  Monitors were applied, and minimal sedation used (see nursing record). The site was prepped with skin prep and allowed to dry, and sterile gloves were used. A high frequency linear ultrasound probe with probe cover was utilized throughout. C5-7 nerve roots located and appeared anatomically normal, local anesthetic injected around them, and echogenic block needle trajectory was monitored throughout. Aspiration performed every 5ml. Lung and blood vessels were avoided. All injections were performed without resistance and free of blood and paresthesias. The patient tolerated the procedure well.  Injectate:  20ml exparel 

## 2024-06-18 ENCOUNTER — Encounter: Payer: Self-pay | Admitting: Surgery

## 2024-09-09 ENCOUNTER — Encounter: Payer: Self-pay | Admitting: Dermatology

## 2024-09-09 ENCOUNTER — Ambulatory Visit: Admitting: Dermatology

## 2024-09-09 DIAGNOSIS — L821 Other seborrheic keratosis: Secondary | ICD-10-CM | POA: Diagnosis not present

## 2024-09-09 DIAGNOSIS — D229 Melanocytic nevi, unspecified: Secondary | ICD-10-CM

## 2024-09-09 DIAGNOSIS — L578 Other skin changes due to chronic exposure to nonionizing radiation: Secondary | ICD-10-CM | POA: Diagnosis not present

## 2024-09-09 DIAGNOSIS — W908XXA Exposure to other nonionizing radiation, initial encounter: Secondary | ICD-10-CM | POA: Diagnosis not present

## 2024-09-09 DIAGNOSIS — D1801 Hemangioma of skin and subcutaneous tissue: Secondary | ICD-10-CM

## 2024-09-09 DIAGNOSIS — L814 Other melanin hyperpigmentation: Secondary | ICD-10-CM

## 2024-09-09 DIAGNOSIS — Z1283 Encounter for screening for malignant neoplasm of skin: Secondary | ICD-10-CM | POA: Diagnosis not present

## 2024-09-09 DIAGNOSIS — Z86007 Personal history of in-situ neoplasm of skin: Secondary | ICD-10-CM

## 2024-09-09 DIAGNOSIS — L570 Actinic keratosis: Secondary | ICD-10-CM | POA: Diagnosis not present

## 2024-09-09 DIAGNOSIS — Z85828 Personal history of other malignant neoplasm of skin: Secondary | ICD-10-CM

## 2024-09-09 NOTE — Progress Notes (Signed)
 Follow-Up Visit   Subjective  Harold Nelson is a 89 y.o. male who presents for the following: Skin Cancer Screening and Upper Body Skin Exam. Hx of SCCs. Hx of AKs.  Areas of concern on scalp.  The patient presents for Upper Body Skin Exam (UBSE) for skin cancer screening and mole check. The patient has spots, moles and lesions to be evaluated, some may be new or changing and the patient may have concern these could be cancer.    The following portions of the chart were reviewed this encounter and updated as appropriate: medications, allergies, medical history  Review of Systems:  No other skin or systemic complaints except as noted in HPI or Assessment and Plan.  Objective  Well appearing patient in no apparent distress; mood and affect are within normal limits.  All skin waist up examined. Relevant physical exam findings are noted in the Assessment and Plan.  Frontal Scalp x3, L parietal scalp x2, L ear post helix x1, L upper back x1, L temple x1, R alar crease x1, R lat neck x1, R post neck x1 (11) Erythematous thin papules/macules with gritty scale.   Assessment & Plan   AK (ACTINIC KERATOSIS) (11) Frontal Scalp x3, L parietal scalp x2, L ear post helix x1, L upper back x1, L temple x1, R alar crease x1, R lat neck x1, R post neck x1 (11) Actinic keratoses are precancerous spots that appear secondary to cumulative UV radiation exposure/sun exposure over time. They are chronic with expected duration over 1 year. A portion of actinic keratoses will progress to squamous cell carcinoma of the skin. It is not possible to reliably predict which spots will progress to skin cancer and so treatment is recommended to prevent development of skin cancer.  Recommend daily broad spectrum sunscreen SPF 30+ to sun-exposed areas, reapply every 2 hours as needed.  Recommend staying in the shade or wearing long sleeves, sun glasses (UVA+UVB protection) and wide brim hats (4-inch brim around the entire  circumference of the hat). Call for new or changing lesions. Destruction of lesion - Frontal Scalp x3, L parietal scalp x2, L ear post helix x1, L upper back x1, L temple x1, R alar crease x1, R lat neck x1, R post neck x1 (11) Complexity: simple   Destruction method: cryotherapy   Informed consent: discussed and consent obtained   Timeout:  patient name, date of birth, surgical site, and procedure verified Lesion destroyed using liquid nitrogen: Yes   Region frozen until ice ball extended beyond lesion: Yes   Cryo cycles: 1 or 2. Outcome: patient tolerated procedure well with no complications   Post-procedure details: wound care instructions given   Additional details:  Prior to procedure, discussed risks of blister formation, small wound, skin dyspigmentation, or rare scar following cryotherapy. Recommend Vaseline ointment to treated areas while healing.   ACTINIC KERATOSES   ACTINIC ELASTOSIS   MULTIPLE BENIGN NEVI   LENTIGINES   SEBORRHEIC KERATOSES   CHERRY ANGIOMA   Skin cancer screening performed today.  HISTORY OF SQUAMOUS CELL CARCINOMA OF THE SKIN - No evidence of recurrence today - No lymphadenopathy - Recommend regular full body skin exams - Recommend daily broad spectrum sunscreen SPF 30+ to sun-exposed areas, reapply every 2 hours as needed.  - Call if any new or changing lesions are noted between office visits  HISTORY OF SQUAMOUS CELL CARCINOMA IN SITU OF THE SKIN.  R post vertex scalp - No evidence of recurrence today - Recommend  regular full body skin exams - Recommend daily broad spectrum sunscreen SPF 30+ to sun-exposed areas, reapply every 2 hours as needed.  - Call if any new or changing lesions are noted between office visits   Actinic Damage - Chronic condition, secondary to cumulative UV/sun exposure - diffuse scaly erythematous macules with underlying dyspigmentation - Recommend daily broad spectrum sunscreen SPF 30+ to sun-exposed areas,  reapply every 2 hours as needed.  - Staying in the shade or wearing long sleeves, sun glasses (UVA+UVB protection) and wide brim hats (4-inch brim around the entire circumference of the hat) are also recommended for sun protection.  - Call for new or changing lesions.  Lentigines, Seborrheic Keratoses, Hemangiomas - Benign normal skin lesions - Benign-appearing - Call for any changes  Melanocytic Nevi - Tan-brown and/or pink-flesh-colored symmetric macules and papules - Benign appearing on exam today - Observation - Call clinic for new or changing moles - Recommend daily use of broad spectrum spf 30+ sunscreen to sun-exposed areas.     Return in about 6 months (around 03/10/2025) for TBSE, HxSCC, HxSCCis, HxAKs.  I, Jill Parcell, CMA, am acting as scribe for Boneta Sharps, MD.   Documentation: I have reviewed the above documentation for accuracy and completeness, and I agree with the above.  Boneta Sharps, MD

## 2024-09-09 NOTE — Patient Instructions (Addendum)

## 2024-10-28 ENCOUNTER — Ambulatory Visit (INDEPENDENT_AMBULATORY_CARE_PROVIDER_SITE_OTHER): Admitting: Podiatry

## 2024-10-28 DIAGNOSIS — B351 Tinea unguium: Secondary | ICD-10-CM | POA: Diagnosis not present

## 2024-10-28 DIAGNOSIS — Z79899 Other long term (current) drug therapy: Secondary | ICD-10-CM

## 2024-10-28 NOTE — Progress Notes (Signed)
 "  Subjective:  Patient ID: Harold Nelson, male    DOB: 03-Aug-1935,  MRN: [id number]  Chief Complaint  Patient presents with   Nail Problem    89 y.o. male presents with the above complaint.  Patient presents with right hallux thickened onychodystrophy mycotic toenails x 1.  He states that has been bothersome for quite some time.  He wants to make sure there is no bacterial infection.  He has been in with a nail fungus for quite some time would like to discuss treatment options he does not have any liver issues   Review of Systems: Negative except as noted in the HPI. Denies N/V/F/Ch.  Past Medical History:  Diagnosis Date   Actinic keratosis    Dislocated shoulder    GERD (gastroesophageal reflux disease)    Hemorrhoids    Hyperlipidemia    Shingles 2012   Sleep apnea    No C-PAP, changed sleep position   Squamous cell carcinoma of skin 09/16/2013   Left forearm. SCCis.    Squamous cell carcinoma of skin 01/13/2018   Left mid vertex. MD SCC.    Squamous cell carcinoma of skin 05/25/2018   Vertex crown/ant. scalp. WD SCC with superficial infiltration.    Squamous cell carcinoma of skin 08/02/2020   Superior sternum - ED&C    Squamous cell carcinoma of skin 03/04/2024   SCC IS right posterior vertex scalp, clear to follow up, Mohs no longer needed   Ulnar neuropathy    Current Medications[1]  Tobacco Use History[2]  Allergies[3] Objective:  There were no vitals filed for this visit. There is no height or weight on file to calculate BMI. Constitutional Well developed. Well nourished.  Vascular Dorsalis pedis pulses palpable bilaterally. Posterior tibial pulses palpable bilaterally. Capillary refill normal to all digits.  No cyanosis or clubbing noted. Pedal hair growth normal.  Neurologic Normal speech. Oriented to person, place, and time. Epicritic sensation to light touch grossly present bilaterally.  Dermatologic Nails right hallux thickened and [place] dystrophic  mycotic toenails x 1 Skin within normal limits  Orthopedic: Normal joint ROM without pain or crepitus bilaterally. No visible deformities. No bony tenderness.   Radiographs: None Assessment:   1. Long-term use of high-risk medication   2. Onychomycosis due to dermatophyte    Plan:  Patient was evaluated and treated and all questions answered.  Right hallux onychomycosis -Educated the patient on the etiology of onychomycosis and various treatment options associated with improving the fungal load.  I explained to the patient that there is 3 treatment options available to treat the onychomycosis including topical, p.o., laser treatment.  Patient elected to undergo p.o. options with Lamisil/terbinafine therapy.  In order for me to start the medication therapy, I explained to the patient the importance of evaluating the liver and obtaining the liver function test.  Once the liver function test comes back normal I will start him on 20-month course of Lamisil therapy.  Patient understood all risk and would like to proceed with Lamisil therapy.  I have asked the patient to immediately stop the Lamisil therapy if she has any reactions to it and call the office or go to the emergency room right away.  Patient states understanding   No follow-ups on file.    [1]  Current Outpatient Medications:    Cholecalciferol 25 MCG (1000 UT) capsule, Take 1,000 Units by mouth daily in the afternoon., Disp: , Rfl:    Coenzyme Q10 100 MG capsule, Take 100 mg by mouth  daily in the afternoon., Disp: , Rfl:    dorzolamide-timolol  (COSOPT) 2-0.5 % ophthalmic solution, Place 1 drop into both eyes at bedtime., Disp: , Rfl:    esomeprazole (NEXIUM) 40 MG capsule, Take 40 mg by mouth daily as needed (heavy/large meals)., Disp: , Rfl:    ketoconazole  (NIZORAL ) 2 % cream, Apply 1 Application topically daily as needed (foot fungus)., Disp: , Rfl:    meloxicam (MOBIC) 7.5 MG tablet, Take 7.5 mg by mouth in the morning.,  Disp: , Rfl:    Misc Natural Products (MENS PROSTATE HEALTH FORMULA PO), Take 1 capsule by mouth daily in the afternoon., Disp: , Rfl:    Multiple Vitamin (MULTIVITAMIN WITH MINERALS) TABS tablet, Take 1 tablet by mouth daily in the afternoon. One A Day Multivitamin, Disp: , Rfl:    oxyCODONE  (ROXICODONE ) 5 MG immediate release tablet, Take 1-2 tablets (5-10 mg total) by mouth every 4 (four) hours as needed for moderate pain (pain score 4-6) or severe pain (pain score 7-10)., Disp: 30 tablet, Rfl: 0   RED YEAST RICE EXTRACT PO, Take 2 capsules by mouth daily in the afternoon., Disp: , Rfl:    tamsulosin  (FLOMAX ) 0.4 MG CAPS capsule, Take 1 capsule (0.4 mg total) by mouth daily., Disp: 30 capsule, Rfl: 11   Turmeric 450 MG CAPS, Take 1 capsule by mouth daily in the afternoon., Disp: , Rfl:  [2]  Social History Tobacco Use  Smoking Status Never  Smokeless Tobacco Never  [3] No Known Allergies  "

## 2024-10-29 ENCOUNTER — Other Ambulatory Visit: Payer: Self-pay | Admitting: Podiatry

## 2024-10-29 ENCOUNTER — Encounter: Payer: Self-pay | Admitting: Podiatry

## 2024-10-29 LAB — HEPATIC FUNCTION PANEL
ALT: 20 IU/L (ref 0–44)
AST: 21 IU/L (ref 0–40)
Albumin: 4 g/dL (ref 3.7–4.7)
Alkaline Phosphatase: 78 IU/L (ref 48–129)
Bilirubin Total: 0.3 mg/dL (ref 0.0–1.2)
Bilirubin, Direct: 0.14 mg/dL (ref 0.00–0.40)
Total Protein: 6.9 g/dL (ref 6.0–8.5)

## 2024-10-29 MED ORDER — TERBINAFINE HCL 250 MG PO TABS: 250.0000 mg | ORAL_TABLET | Freq: Every day | ORAL | 0 refills | Status: AC

## 2025-01-11 ENCOUNTER — Ambulatory Visit: Admitting: Urology

## 2025-01-12 ENCOUNTER — Ambulatory Visit: Admitting: Urology

## 2025-03-10 ENCOUNTER — Ambulatory Visit: Admitting: Dermatology
